# Patient Record
Sex: Male | Born: 1955 | Race: White | Hispanic: No | Marital: Married | Smoking: Never smoker
Health system: Southern US, Community
[De-identification: ages and names within clinical notes are randomized; demographics above are authoritative.]

## PROBLEM LIST (undated history)

## (undated) DIAGNOSIS — C801 Malignant (primary) neoplasm, unspecified: Secondary | ICD-10-CM

## (undated) DIAGNOSIS — B1920 Unspecified viral hepatitis C without hepatic coma: Secondary | ICD-10-CM

## (undated) HISTORY — PX: HERNIA REPAIR: SHX51

## (undated) HISTORY — DX: Unspecified viral hepatitis C without hepatic coma: B19.20

---

## 2018-01-18 ENCOUNTER — Emergency Department: Admission: EM | Admit: 2018-01-18 | Discharge: 2018-01-18 | Discharge status: Absent without leave | Payer: Self-pay

## 2018-01-18 NOTE — ED Triage Notes (Signed)
Pt arrives ambulatory to ED with c/o chest pain. Pt decided to leave upon registration without giving his address. Pt left ED at this time.

## 2019-01-12 ENCOUNTER — Emergency Department
Admission: EM | Admit: 2019-01-12 | Discharge: 2019-01-12 | Disposition: A | Discharge status: Home / Self Care | Payer: Medicaid Other | Attending: Emergency Medicine | Admitting: Emergency Medicine

## 2019-01-12 ENCOUNTER — Other Ambulatory Visit: Payer: Self-pay

## 2019-01-12 ENCOUNTER — Encounter: Payer: Self-pay | Admitting: Emergency Medicine

## 2019-01-12 ENCOUNTER — Telehealth: Payer: Self-pay | Admitting: Internal Medicine

## 2019-01-12 ENCOUNTER — Emergency Department: Payer: Medicaid Other

## 2019-01-12 DIAGNOSIS — Z79899 Other long term (current) drug therapy: Secondary | ICD-10-CM | POA: Insufficient documentation

## 2019-01-12 DIAGNOSIS — R9389 Abnormal findings on diagnostic imaging of other specified body structures: Secondary | ICD-10-CM | POA: Insufficient documentation

## 2019-01-12 DIAGNOSIS — R1030 Lower abdominal pain, unspecified: Secondary | ICD-10-CM | POA: Diagnosis present

## 2019-01-12 DIAGNOSIS — C229 Malignant neoplasm of liver, not specified as primary or secondary: Secondary | ICD-10-CM

## 2019-01-12 LAB — URINALYSIS, COMPLETE (UACMP) WITH MICROSCOPIC
Bacteria, UA: NONE SEEN
Bilirubin Urine: NEGATIVE
Glucose, UA: NEGATIVE mg/dL
Hgb urine dipstick: NEGATIVE
Ketones, ur: NEGATIVE mg/dL
Leukocytes,Ua: NEGATIVE
Nitrite: NEGATIVE
Protein, ur: 30 mg/dL — AB
Specific Gravity, Urine: 1.021 (ref 1.005–1.030)
Squamous Epithelial / HPF: NONE SEEN (ref 0–5)
pH: 5 (ref 5.0–8.0)

## 2019-01-12 LAB — CBC
HCT: 37.5 % — ABNORMAL LOW (ref 39.0–52.0)
Hemoglobin: 13.1 g/dL (ref 13.0–17.0)
MCH: 34.6 pg — ABNORMAL HIGH (ref 26.0–34.0)
MCHC: 34.9 g/dL (ref 30.0–36.0)
MCV: 98.9 fL (ref 80.0–100.0)
Platelets: 98 10*3/uL — ABNORMAL LOW (ref 150–400)
RBC: 3.79 MIL/uL — ABNORMAL LOW (ref 4.22–5.81)
RDW: 14.2 % (ref 11.5–15.5)
WBC: 5.6 10*3/uL (ref 4.0–10.5)
nRBC: 0 % (ref 0.0–0.2)

## 2019-01-12 LAB — COMPREHENSIVE METABOLIC PANEL
ALT: 67 U/L — ABNORMAL HIGH (ref 0–44)
AST: 177 U/L — ABNORMAL HIGH (ref 15–41)
Albumin: 2.7 g/dL — ABNORMAL LOW (ref 3.5–5.0)
Alkaline Phosphatase: 197 U/L — ABNORMAL HIGH (ref 38–126)
Anion gap: 9 (ref 5–15)
BUN: 17 mg/dL (ref 6–20)
CO2: 27 mmol/L (ref 22–32)
Calcium: 9.5 mg/dL (ref 8.9–10.3)
Chloride: 98 mmol/L (ref 98–111)
Creatinine, Ser: 0.42 mg/dL — ABNORMAL LOW (ref 0.61–1.24)
GFR calc Af Amer: >60 mL/min (ref >60)
GFR calc non Af Amer: >60 mL/min (ref >60)
Glucose, Bld: 106 mg/dL — ABNORMAL HIGH (ref 70–99)
Potassium: 4 mmol/L (ref 3.5–5.1)
Sodium: 134 mmol/L — ABNORMAL LOW (ref 135–145)
Total Bilirubin: 2.1 mg/dL — ABNORMAL HIGH (ref 0.3–1.2)
Total Protein: 7.5 g/dL (ref 6.5–8.1)

## 2019-01-12 LAB — LIPASE, BLOOD: Lipase: 32 U/L (ref 11–51)

## 2019-01-12 MED ORDER — IOHEXOL 240 MG/ML SOLN
50.0000 mL | Freq: Once | INTRAMUSCULAR | Status: AC | PRN
  Administered 2019-01-12: 09:00:00 50 mL via ORAL

## 2019-01-12 MED ORDER — TRAMADOL HCL 50 MG PO TABS: 50.0000 mg | ORAL_TABLET | Freq: Four times a day (QID) | ORAL | 0 refills | Status: DC | PRN

## 2019-01-12 MED ORDER — IOHEXOL 300 MG/ML  SOLN
100.0000 mL | Freq: Once | INTRAMUSCULAR | Status: AC | PRN
  Administered 2019-01-12: 10:00:00 100 mL via INTRAVENOUS

## 2019-01-12 MED ORDER — SODIUM CHLORIDE 0.9% FLUSH: 3.0000 mL | Freq: Once | INTRAVENOUS | Status: DC

## 2019-01-12 MED ORDER — TRAMADOL HCL 50 MG PO TABS
50.0000 mg | ORAL_TABLET | Freq: Once | ORAL | Status: AC
  Administered 2019-01-12: 50 mg via ORAL
  Filled 2019-01-12: qty 1

## 2019-01-12 NOTE — ED Notes (Signed)
Assumed care of patient a0x4, reports abd pain level 5/10. Ct completed. Vss. md aware of patients pain level. Awaiting further plan of care. Safety maintained. Call light w/i reach.m

## 2019-01-12 NOTE — ED Notes (Signed)
ED Provider at bedside. 

## 2019-01-12 NOTE — ED Triage Notes (Signed)
Says abdominal pain--mostly lower abdomen--for 3 weeks. Feels like gas pains.  Says it started with diarrhea.  He took otc meds and then got constipated.  Got better and then whent back to the diarrhea.  Has been missing work.

## 2019-01-12 NOTE — ED Notes (Signed)
sst top drawn and sent to lab.

## 2019-01-12 NOTE — Discharge Instructions (Addendum)
As we discussed your CT scan demonstrates multiple nodules in your lungs and liver that are concerning for cancer, I have discussed with Dr. Rogue Bussing and he anticipates close follow-up with you this week for further evaluation and plan

## 2019-01-12 NOTE — Telephone Encounter (Signed)
Spoke to Dr.Kinner; liver lesions/lung lesions; ? [place]. Medically stable; out pt follow up.    Please have pt follow up with me tomorrow at 2:45 [Dx- liver cancer]; no labs

## 2019-01-12 NOTE — ED Notes (Signed)
Patient transported to CT 

## 2019-01-12 NOTE — ED Notes (Signed)
md at bedside to discuss further plan of care. Patient sitting in bed watching TV.

## 2019-01-12 NOTE — ED Provider Notes (Signed)
[facility] Emergency Department Provider Note   ____________________________________________    I have reviewed the triage vital signs and the nursing notes.   HISTORY  Chief Complaint Abdominal Pain, Nausea, and Diarrhea     HPI Jacob Wilcox is a 63 y.o. male who presents with complaints of 3 weeks of lower abdominal pain which he describes as moderate to severe and constant.  He reports during that time he has had both diarrhea and constipation after taking over-the-counter Pepto-Bismol.  When he stopped his diarrhea has returned.  No recent travel.  No nausea or vomiting.  No fevers reported.  No cough  History reviewed. No pertinent past medical history.  There are no active problems to display for this patient.   Past Surgical History:  Procedure Laterality Date  . HERNIA REPAIR     not sure what it was--when he was a baby    Prior to Admission medications   Medication Sig Start Date End Date Taking? Authorizing Provider  Bismuth Subsalicylate (PEPTO-BISMOL PO) Take by mouth daily as needed.   Yes [provider]  Loperamide HCl (IMODIUM A-D PO) Take by mouth daily as needed.   Yes [provider]  vitamin B-12 (CYANOCOBALAMIN) 1000 MCG tablet Take 1,000 mcg by mouth daily.   Yes [provider]     Allergies Poison ivy extract  No family history on file.  Social History Social History   Tobacco Use  . Smoking status: Never Smoker  . Smokeless tobacco: Never Used  Substance Use Topics  . Alcohol use: Yes  . Drug use: Not on file    Review of Systems  Constitutional: No fever/chills Eyes: No visual changes.  ENT: No sore throat. Cardiovascular: Denies chest pain. Respiratory: Denies shortness of breath. Gastrointestinal: As above Genitourinary: Negative for dysuria. Musculoskeletal: Negative for back pain. Skin: Negative for rash. Neurological: Negative for headaches     ____________________________________________   PHYSICAL EXAM:  VITAL SIGNS: ED Triage Vitals [01/12/19 0848]  Enc Vitals Group     BP 116/64     Pulse Rate (!) 104     Resp 16     Temp 98.2 F (36.8 C)     Temp Source Oral     SpO2 96 %     Weight      Height      Head Circumference      Peak Flow      Pain Score 4     Pain Loc      Pain Edu?      Excl. in [place]?     Constitutional: Alert and oriented  Nose: No congestion/rhinnorhea. Mouth/Throat: Mucous membranes are moist.    Cardiovascular: Normal rate, regular rhythm. Grossly normal heart sounds.  Good peripheral circulation. Respiratory: Normal respiratory effort.  No retractions. Lungs CTAB. Gastrointestinal: Mild distention, tenderness in the left lower quadrant and right lower quadrant, no CVA tenderness  Musculoskeletal: 1+ edema bilaterally warm and well perfused Neurologic:  Normal speech and language. No gross focal neurologic deficits are appreciated.  Skin:  Skin is warm, dry and intact. No rash noted. Psychiatric: Mood and affect are normal. Speech and behavior are normal.  ____________________________________________   LABS (all labs ordered are listed, but only abnormal results are displayed)  Labs Reviewed  COMPREHENSIVE METABOLIC PANEL - Abnormal; Notable for the following components:      Result Value   Sodium 134 (*)    Glucose, Bld 106 (*)  Creatinine, Ser 0.42 (*)    Albumin 2.7 (*)    AST 177 (*)    ALT 67 (*)    Alkaline Phosphatase 197 (*)    Total Bilirubin 2.1 (*)    All other components within normal limits  CBC - Abnormal; Notable for the following components:   RBC 3.79 (*)    HCT 37.5 (*)    MCH 34.6 (*)    Platelets 98 (*)    All other components within normal limits  URINALYSIS, COMPLETE (UACMP) WITH MICROSCOPIC - Abnormal; Notable for the following components:   Color, Urine AMBER (*)    APPearance HAZY (*)    Protein, ur 30 (*)    All other components within normal  limits  LIPASE, BLOOD  AFP TUMOR MARKER   ____________________________________________  EKG  None ____________________________________________  RADIOLOGY  CT abdomen pelvis demonstrates findings concerning for metastatic cancer ____________________________________________   PROCEDURES  Procedure(s) performed: No  Procedures   Critical Care performed:No ____________________________________________   INITIAL IMPRESSION / ASSESSMENT AND PLAN / ED COURSE  Pertinent labs & imaging results that were available during my care of the patient were reviewed by me and considered in my medical decision making (see chart for details).  Patient presents with lower abdominal pain x3 weeks, he is tender in the left lower quadrant right lower quadrant.  We will obtain labs, imaging and reevaluate.  Patient CT scan highly concerning for metastatic CA, I discussed this with him at length.  He does have a history of daily alcohol use, hepatitis C making  Overly more likely.  Will discuss with oncology to arrange for close follow-up, plan   Discussed with Dr. Rogue Bussing, he will follow-up with patient this week, explained process to the patient he is comfortable with this plan    ____________________________________________   FINAL CLINICAL IMPRESSION(S) / ED DIAGNOSES  Final diagnoses:  Malignant neoplasm of liver, unspecified liver malignancy type [facility])        Note:  This document was prepared using Dragon voice recognition software and may include unintentional dictation errors.   Lavonia Drafts, MD 01/12/19 [phone]

## 2019-01-13 ENCOUNTER — Encounter: Payer: Self-pay | Admitting: Internal Medicine

## 2019-01-13 ENCOUNTER — Other Ambulatory Visit: Payer: Self-pay

## 2019-01-13 ENCOUNTER — Encounter: Payer: Self-pay | Admitting: *Deleted

## 2019-01-13 ENCOUNTER — Inpatient Hospital Stay: Payer: Medicaid Other

## 2019-01-13 ENCOUNTER — Inpatient Hospital Stay: Payer: Medicaid Other | Attending: Internal Medicine | Admitting: Internal Medicine

## 2019-01-13 VITALS — BP 127/80 | HR 98 | Temp 98.3°F | Resp 20 | Ht 70.0 in | Wt 158.0 lb

## 2019-01-13 DIAGNOSIS — R140 Abdominal distension (gaseous): Secondary | ICD-10-CM | POA: Insufficient documentation

## 2019-01-13 DIAGNOSIS — R160 Hepatomegaly, not elsewhere classified: Secondary | ICD-10-CM | POA: Diagnosis not present

## 2019-01-13 DIAGNOSIS — Z9221 Personal history of antineoplastic chemotherapy: Secondary | ICD-10-CM | POA: Insufficient documentation

## 2019-01-13 DIAGNOSIS — K7460 Unspecified cirrhosis of liver: Secondary | ICD-10-CM | POA: Insufficient documentation

## 2019-01-13 DIAGNOSIS — R5381 Other malaise: Secondary | ICD-10-CM | POA: Insufficient documentation

## 2019-01-13 DIAGNOSIS — Z7189 Other specified counseling: Secondary | ICD-10-CM

## 2019-01-13 DIAGNOSIS — R630 Anorexia: Secondary | ICD-10-CM | POA: Diagnosis not present

## 2019-01-13 DIAGNOSIS — R197 Diarrhea, unspecified: Secondary | ICD-10-CM | POA: Diagnosis not present

## 2019-01-13 DIAGNOSIS — C220 Liver cell carcinoma: Secondary | ICD-10-CM | POA: Insufficient documentation

## 2019-01-13 DIAGNOSIS — R5383 Other fatigue: Secondary | ICD-10-CM | POA: Diagnosis not present

## 2019-01-13 DIAGNOSIS — G893 Neoplasm related pain (acute) (chronic): Secondary | ICD-10-CM | POA: Insufficient documentation

## 2019-01-13 DIAGNOSIS — R634 Abnormal weight loss: Secondary | ICD-10-CM

## 2019-01-13 DIAGNOSIS — Z79899 Other long term (current) drug therapy: Secondary | ICD-10-CM | POA: Diagnosis not present

## 2019-01-13 DIAGNOSIS — F17210 Nicotine dependence, cigarettes, uncomplicated: Secondary | ICD-10-CM | POA: Diagnosis not present

## 2019-01-13 DIAGNOSIS — R600 Localized edema: Secondary | ICD-10-CM | POA: Diagnosis not present

## 2019-01-13 DIAGNOSIS — R110 Nausea: Secondary | ICD-10-CM | POA: Insufficient documentation

## 2019-01-13 DIAGNOSIS — R109 Unspecified abdominal pain: Secondary | ICD-10-CM

## 2019-01-13 DIAGNOSIS — R21 Rash and other nonspecific skin eruption: Secondary | ICD-10-CM | POA: Diagnosis not present

## 2019-01-13 DIAGNOSIS — Z5112 Encounter for antineoplastic immunotherapy: Secondary | ICD-10-CM | POA: Diagnosis present

## 2019-01-13 DIAGNOSIS — C229 Malignant neoplasm of liver, not specified as primary or secondary: Secondary | ICD-10-CM | POA: Insufficient documentation

## 2019-01-13 DIAGNOSIS — B1920 Unspecified viral hepatitis C without hepatic coma: Secondary | ICD-10-CM | POA: Insufficient documentation

## 2019-01-13 LAB — URINALYSIS, COMPLETE (UACMP) WITH MICROSCOPIC
Bacteria, UA: NONE SEEN
Bilirubin Urine: NEGATIVE
Glucose, UA: NEGATIVE mg/dL
Hgb urine dipstick: NEGATIVE
Ketones, ur: NEGATIVE mg/dL
Leukocytes,Ua: NEGATIVE
Nitrite: NEGATIVE
Protein, ur: NEGATIVE mg/dL
Specific Gravity, Urine: 1.019 (ref 1.005–1.030)
Squamous Epithelial / LPF: NONE SEEN (ref 0–5)
pH: 5 (ref 5.0–8.0)

## 2019-01-13 LAB — APTT: aPTT: 28 seconds (ref 24–36)

## 2019-01-13 LAB — PROTIME-INR
INR: 1.1 (ref 0.8–1.2)
Prothrombin Time: 13.7 seconds (ref 11.4–15.2)

## 2019-01-13 LAB — AFP TUMOR MARKER

## 2019-01-13 LAB — TSH: TSH: 1.627 u[IU]/mL (ref 0.350–4.500)

## 2019-01-13 MED ORDER — OXYCODONE HCL 5 MG PO TABS: 5.0000 mg | ORAL_TABLET | Freq: Four times a day (QID) | ORAL | 0 refills | Status: DC | PRN

## 2019-01-13 NOTE — Progress Notes (Signed)
[facility] CONSULT NOTE  Patient Care Team: Patient, No Pcp Per as PCP - General (General Practice)  CHIEF COMPLAINTS/PURPOSE OF CONSULTATION: Liver cancer  #  Oncology History Overview Note  # July 2020-   # Early 1990s- Hep C [needle sharing >35 years in [place]; no treatment.    Cancer, hepatocellular ([place])  01/13/2019 Initial Diagnosis   Cancer, hepatocellular ([place])   01/14/2019 -  Chemotherapy   The patient had bevacizumab (AVASTIN) 1,075 mg in sodium chloride 0.9 % 100 mL chemo infusion, 15 mg/kg, Intravenous,  Once, 0 of 6 cycles atezolizumab (TECENTRIQ) 1,200 mg in sodium chloride 0.9 % 250 mL chemo infusion, 1,200 mg, Intravenous, Once, 0 of 6 cycles  for chemotherapy treatment.       HISTORY OF PRESENTING ILLNESS:  Jacob Wilcox 63 y.o.  male patient with history of smoking/alcohol use/hepatitis C [untreated] is currently in the clinic to be evaluated for his liver mass after found on imaging in the emergency room a day ago.  Patient states that he has been having diarrhea 8-10 loose stools a day without any blood for the last 2 weeks.  Complains of abdominal cramps.  Also notes to have abdominal discomfort/distention with bloating over the last 2 to 3 weeks.  Complains of poor appetite.  Positive for nausea no vomiting.  Weight loss.  Extreme fatigue.  Review of Systems  Constitutional: Positive for malaise/fatigue and weight loss. Negative for chills, diaphoresis and fever.  HENT: Negative for nosebleeds and sore throat.   Eyes: Negative for double vision.  Respiratory: Negative for cough, hemoptysis, sputum production, shortness of breath and wheezing.   Cardiovascular: Negative for chest pain, palpitations, orthopnea and leg swelling.  Gastrointestinal: Positive for abdominal pain, diarrhea and nausea. Negative for blood in stool, constipation, heartburn and melena.  Genitourinary: Negative for dysuria, frequency and urgency.  Musculoskeletal:  Negative for back pain and joint pain.  Skin: Negative.  Negative for itching and rash.  Neurological: Negative for dizziness, tingling, focal weakness, weakness and headaches.  Endo/Heme/Allergies: Does not bruise/bleed easily.  Psychiatric/Behavioral: Negative for depression. The patient is not nervous/anxious and does not have insomnia.      MEDICAL HISTORY:  History reviewed. No pertinent past medical history.  SURGICAL HISTORY: Past Surgical History:  Procedure Laterality Date  . HERNIA REPAIR     not sure what it was--when he was a baby    SOCIAL HISTORY: Social History   Socioeconomic History  . Marital status: Married    Spouse name: Not on file  . Number of children: Not on file  . Years of education: Not on file  . Highest education level: Not on file  Occupational History  . Not on file  Social Needs  . Financial resource strain: Not on file  . Food insecurity    Worry: Not on file    Inability: Not on file  . Transportation needs    Medical: Not on file    Non-medical: Not on file  Tobacco Use  . Smoking status: Never Smoker  . Smokeless tobacco: Never Used  Substance and Sexual Activity  . Alcohol use: Yes  . Drug use: Not on file  . Sexual activity: Not on file  Lifestyle  . Physical activity    Days per week: Not on file    Minutes per session: Not on file  . Stress: Not on file  Relationships  . Social connections    Talks on phone: Not on file  Gets together: Not on file    Attends religious service: Not on file    Active member of club or organization: Not on file    Attends meetings of clubs or organizations: Not on file    Relationship status: Not on file  . Intimate partner violence    Fear of current or ex partner: Not on file    Emotionally abused: Not on file    Physically abused: Not on file    Forced sexual activity: Not on file  Other Topics Concern  . Not on file  Social History Narrative   Lives in [place]; with wife;  Engineer, drilling; smoke 1ppd x 30 years; alcohol 6 pack beer/day.     FAMILY HISTORY: History reviewed. No pertinent family history.  ALLERGIES:  is allergic to poison ivy extract.  MEDICATIONS:  Current Outpatient Medications  Medication Sig Dispense Refill  . Loperamide HCl (IMODIUM A-D PO) Take by mouth daily as needed.    Marland Kitchen oxyCODONE (OXY IR/ROXICODONE) 5 MG immediate release tablet Take 1 tablet (5 mg total) by mouth every 6 (six) hours as needed for severe pain. 45 tablet 0  . traMADol (ULTRAM) 50 MG tablet Take 1 tablet (50 mg total) by mouth every 6 (six) hours as needed. (Patient not taking: Reported on 01/13/2019) 20 tablet 0  . vitamin B-12 (CYANOCOBALAMIN) 1000 MCG tablet Take 1,000 mcg by mouth daily.     No current facility-administered medications for this visit.       Marland Kitchen  PHYSICAL EXAMINATION: ECOG PERFORMANCE STATUS: 1 - Symptomatic but completely ambulatory  Vitals:   01/13/19 1520  BP: 127/80  Pulse: 98  Resp: 20  Temp: 98.3 F (36.8 C)   Filed Weights   01/13/19 1520  Weight: 158 lb (71.7 kg)    Physical Exam  Constitutional: He is oriented to person, place, and time and well-developed, well-nourished, and in no distress.  Mild distress because of abdominal cramping.  Alone.  Wife over the phone.  HENT:  Head: Normocephalic and atraumatic.  Mouth/Throat: Oropharynx is clear and moist. No oropharyngeal exudate.  Eyes: Pupils are equal, round, and reactive to light.  Neck: Normal range of motion. Neck supple.  Cardiovascular: Normal rate and regular rhythm.  Pulmonary/Chest: No respiratory distress. He has no wheezes.  Decreased air entry bilaterally.  Abdominal: Soft. Bowel sounds are normal. He exhibits distension. He exhibits no mass. There is abdominal tenderness. There is no rebound and no guarding.  Positive for hepatomegaly/up to the umbilicus.  Musculoskeletal: Normal range of motion.        General: No tenderness or edema.  Neurological: He is  alert and oriented to person, place, and time.  Skin: Skin is warm.  Psychiatric: Affect normal.     LABORATORY DATA:  I have reviewed the data as listed Lab Results  Component Value Date   WBC 5.6 01/12/2019   HGB 13.1 01/12/2019   HCT 37.5 (L) 01/12/2019   MCV 98.9 01/12/2019   PLT 98 (L) 01/12/2019   Recent Labs    01/12/19 0911  NA 134*  K 4.0  CL 98  CO2 27  GLUCOSE 106*  BUN 17  CREATININE 0.42*  CALCIUM 9.5  GFRNONAA >60  GFRAA >60  PROT 7.5  ALBUMIN 2.7*  AST 177*  ALT 67*  ALKPHOS 197*  BILITOT 2.1*    RADIOGRAPHIC STUDIES: I have personally reviewed the radiological images as listed and agreed with the findings in the report. Ct Abdomen Pelvis W Contrast  Result Date: 01/12/2019 CLINICAL DATA:  Abdominal distension, lower abdominal pain EXAM: CT ABDOMEN AND PELVIS WITH CONTRAST TECHNIQUE: Multidetector CT imaging of the abdomen and pelvis was performed using the standard protocol following bolus administration of intravenous contrast. CONTRAST:  133mL OMNIPAQUE IOHEXOL 300 MG/ML  SOLN COMPARISON:  None. FINDINGS: Lower chest: Numerous bilateral pulmonary nodules seen in the lower lungs. Index right lower lobe nodule on image 1 measures 9 mm. Index left lower lobe nodule on image 4 measures 7 mm. No effusions. Heart is normal size. Hepatobiliary: Nodular contours within the liver compatible with cirrhosis. There are numerous ill-defined low-density masses in the liver concerning for malignancy. Index lesion in the hepatic dome measures approximately 5.4 cm. Ill-defined low-density lesions throughout the right hepatic lobe predominantly. Filling defect in the main portal vein and left portal vein compatible with partially occlusive thrombus. Right portal vein is not visualized, likely occluded. Pancreas: No focal abnormality or ductal dilatation. Spleen: No focal abnormality.  Normal size. Adrenals/Urinary Tract: No adrenal abnormality. No focal renal abnormality. No  stones or hydronephrosis. Urinary bladder is unremarkable. Stomach/Bowel: Stomach, large and small bowel grossly unremarkable. Vascular/Lymphatic: Aortic atherosclerosis. No enlarged abdominal or pelvic lymph nodes. Reproductive: No visible focal abnormality. Other: Moderate free fluid in the pelvis and around the liver. No free air. Musculoskeletal: No acute bony abnormality. IMPRESSION: Nodular contours of the liver with enlargement of the left hepatic lobe compatible with cirrhosis. Extensive ill-defined low-density areas predominantly throughout the right lobe of the liver. It is difficult to determine if this is primary hepatocellular carcinoma or metastases. 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 Right portal vein is not visualized and may be occluded. Numerous bilateral lower lobe lung pulmonary nodules compatible with metastases. Moderate ascites around the liver and in the pelvis. Aortic atherosclerosis. Electronically Signed   By: Rolm Baptise M.D.   On: 01/12/2019 10:45    ASSESSMENT & PLAN:   Cancer, hepatocellular ([place]) #Multiple liver masses-elevated alpha-fetoprotein 3 40,000; highly suggestive of hepatocellular cancer.  Discussed my concerns with the patient and wife in detail.  #I would recommend further MRI of the liver stat for further evaluation.  If inconclusive would recommend biopsy.  #Discussed the treatment options for hepatocellular cancer [if confirmed on MRI plus minus biopsy]-includes multiple options-however I feel that given current clinical situation need for response-would recommend Tecentriq plus Avastin every 3 weeks.  Discussed the potential side effects including but not limited to bleeding wound healing issues.  I discussed the mechanism of action; The goal of therapy is palliative; and length of treatments are likely ongoing/based upon the results of the scans. Discussed the potential side effects of immunotherapy including but not  limited to diarrhea; skin rash; elevated LFTs/endocrine abnormalities etc. discussed response rates therapy 30%.  Goal of treatments are palliative not curative.   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  Discussed with radiology hard to differentiate; possibly tumor embolus.  Await MRI.  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  Check PT PTT.  # Hepatitis C-untreated-check hepatitis C RNA; antibodies.  # Diarrhea/ abdominal cramps-the etiology is unclear; CT scan shows no acute small bowel/colon process.  Check GI PCR/C. difficile panel.  Continue Imodium prescription of oxycodone given.  #Social issues-no insurance.  Will refer to Education officer, museum.  Thank you,allowing me to participate in the care of your pleasant patient. Please do not hesitate to contact me with questions or concerns in the interim.  DISPOSITION: #labs-  today; # Stool studies/ C.diff; UA # MRI LIVER STAT # covid-19 testing.  # chemo class ASAP/palliative care consultation. # Follow up in 1 week- MD; No labs- start Avastin/ Tecntriq-Dr.B     All questions were answered. The patient knows to call the clinic with any problems, questions or concerns.       Cammie Sickle, MD 01/14/2019 4:27 PM

## 2019-01-13 NOTE — Patient Instructions (Signed)
We have scheduled your covid screening/testing on July 23rd at 1pm.  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 Someone will direct you where to park. Stay in your car and someone will be with you shortly.  You must self isolate after this test until the day of your first infusion. This is to ensure you are not exposed to anyone with covid in the interim of receiving these test results.

## 2019-01-13 NOTE — Patient Instructions (Signed)
Bevacizumab injection What is this medicine? BEVACIZUMAB (be va SIZ yoo mab) is a monoclonal antibody. It is used to treat many types of cancer. This medicine may be used for other purposes; ask your health care provider or pharmacist if you have questions. COMMON BRAND NAME(S): Avastin, MVASI, Zirabev What should I tell my health care provider before I take this medicine? They need to know if you have any of these conditions:  diabetes  heart disease  high blood pressure  history of coughing up blood  prior anthracycline chemotherapy (e.g., doxorubicin, daunorubicin, epirubicin)  recent or ongoing radiation therapy  recent or planning to have surgery  stroke  an unusual or allergic reaction to bevacizumab, hamster proteins, mouse proteins, other medicines, foods, dyes, or preservatives  pregnant or trying to get pregnant  breast-feeding How should I use this medicine? This medicine is for infusion into a vein. It is given by a health care professional in a hospital or clinic setting. Talk to your pediatrician regarding the use of this medicine in children. Special care may be needed. Overdosage: If you think you have taken too much of this medicine contact a poison control center or emergency room at once. NOTE: This medicine is only for you. Do not share this medicine with others. What if I miss a dose? It is important not to miss your dose. Call your doctor or health care professional if you are unable to keep an appointment. What may interact with this medicine? Interactions are not expected. This list may not describe all possible interactions. Give your health care provider a list of all the medicines, herbs, non-prescription drugs, or dietary supplements you use. Also tell them if you smoke, drink alcohol, or use illegal drugs. Some items may interact with your medicine. What should I watch for while using this medicine? Your condition will be monitored carefully while  you are receiving this medicine. You will need important blood work and urine testing done while you are taking this medicine. This medicine may increase your risk to bruise or bleed. Call your doctor or health care professional if you notice any unusual bleeding. This medicine should be started at least 28 days following major surgery and the site of the surgery should be totally healed. Check with your doctor before scheduling dental work or surgery while you are receiving this treatment. Talk to your doctor if you have recently had surgery or if you have a wound that has not healed. Do not become pregnant while taking this medicine or for 6 months after stopping it. Women should inform their doctor if they wish to become pregnant or think they might be pregnant. There is a potential for serious side effects to an unborn child. Talk to your health care professional or pharmacist for more information. Do not breast-feed an infant while taking this medicine and for 6 months after the last dose. This medicine has caused ovarian failure in some women. This medicine may interfere with the ability to have a child. You should talk to your doctor or health care professional if you are concerned about your fertility. What side effects may I notice from receiving this medicine? Side effects that you should report to your doctor or health care professional as soon as possible:  allergic reactions like skin rash, itching or hives, swelling of the face, lips, or tongue  chest pain or chest tightness  chills  coughing up blood  high fever  seizures  severe constipation  signs and symptoms   of bleeding such as bloody or black, tarry stools; red or dark-brown urine; spitting up blood or brown material that looks like coffee grounds; red spots on the skin; unusual bruising or bleeding from the eye, gums, or nose  signs and symptoms of a blood clot such as breathing problems; chest pain; severe, sudden  headache; pain, swelling, warmth in the leg  signs and symptoms of a stroke like changes in vision; confusion; trouble speaking or understanding; severe headaches; sudden numbness or weakness of the face, arm or leg; trouble walking; dizziness; loss of balance or coordination  stomach pain  sweating  swelling of legs or ankles  vomiting  weight gain Side effects that usually do not require medical attention (report to your doctor or health care professional if they continue or are bothersome):  back pain  changes in taste  decreased appetite  dry skin  nausea  tiredness This list may not describe all possible side effects. Call your doctor for medical advice about side effects. You may report side effects to FDA at 1-800-FDA-1088. Where should I keep my medicine? This drug is given in a hospital or clinic and will not be stored at home. NOTE: This sheet is a summary. It may not cover all possible information. If you have questions about this medicine, talk to your doctor, pharmacist, or health care provider.  2020 Elsevier/Gold Standard (2016-06-08 14:33:29) Atezolizumab injection What is this medicine? ATEZOLIZUMAB (a te zoe LIZ ue mab) is a monoclonal antibody. It is used to treat bladder cancer (urothelial cancer), non-small cell lung cancer, small cell lung cancer, and breast cancer. This medicine may be used for other purposes; ask your health care provider or pharmacist if you have questions. COMMON BRAND NAME(S): Tecentriq What should I tell my health care provider before I take this medicine? They need to know if you have any of these conditions:  diabetes  immune system problems  infection  inflammatory bowel disease  liver disease  lung or breathing disease  lupus  nervous system problems like myasthenia gravis or Guillain-Barre syndrome  organ transplant  an unusual or allergic reaction to atezolizumab, other medicines, foods, dyes, or  preservatives  pregnant or trying to get pregnant  breast-feeding How should I use this medicine? This medicine is for infusion into a vein. It is given by a health care professional in a hospital or clinic setting. A special MedGuide will be given to you before each treatment. Be sure to read this information carefully each time. Talk to your pediatrician regarding the use of this medicine in children. Special care may be needed. Overdosage: If you think you have taken too much of this medicine contact a poison control center or emergency room at once. NOTE: This medicine is only for you. Do not share this medicine with others. What if I miss a dose? It is important not to miss your dose. Call your doctor or health care professional if you are unable to keep an appointment. What may interact with this medicine? Interactions have not been studied. This list may not describe all possible interactions. Give your health care provider a list of all the medicines, herbs, non-prescription drugs, or dietary supplements you use. Also tell them if you smoke, drink alcohol, or use illegal drugs. Some items may interact with your medicine. What should I watch for while using this medicine? Your condition will be monitored carefully while you are receiving this medicine. You may need blood work done while you are taking   this medicine. Do not become pregnant while taking this medicine or for at least 5 months after stopping it. Women should inform their doctor if they wish to become pregnant or think they might be pregnant. There is a potential for serious side effects to an unborn child. Talk to your health care professional or pharmacist for more information. Do not breast-feed an infant while taking this medicine or for at least 5 months after the last dose. What side effects may I notice from receiving this medicine? Side effects that you should report to your doctor or health care professional as soon  as possible:  allergic reactions like skin rash, itching or hives, swelling of the face, lips, or tongue  black, tarry stools  bloody or watery diarrhea  breathing problems  changes in vision  chest pain or chest tightness  chills  facial flushing  fever  headache  signs and symptoms of high blood sugar such as dizziness; dry mouth; dry skin; fruity breath; nausea; stomach pain; increased hunger or thirst; increased urination  signs and symptoms of liver injury like dark yellow or brown urine; general ill feeling or flu-like symptoms; light-colored stools; loss of appetite; nausea; right upper belly pain; unusually weak or tired; yellowing of the eyes or skin  stomach pain  trouble passing urine or change in the amount of urine Side effects that usually do not require medical attention (report to your doctor or health care professional if they continue or are bothersome):  cough  diarrhea  joint pain  muscle pain  muscle weakness  tiredness  weight loss This list may not describe all possible side effects. Call your doctor for medical advice about side effects. You may report side effects to FDA at 1-800-FDA-1088. Where should I keep my medicine? This drug is given in a hospital or clinic and will not be stored at home. NOTE: This sheet is a summary. It may not cover all possible information. If you have questions about this medicine, talk to your doctor, pharmacist, or health care provider.  2020 Elsevier/Gold Standard (2017-09-13 09:33:38)  

## 2019-01-13 NOTE — Assessment & Plan Note (Addendum)
#  Multiple liver masses-elevated alpha-fetoprotein 3 40,000; highly suggestive of hepatocellular cancer.  Discussed my concerns with the patient and wife in detail.  #I would recommend further MRI of the liver stat for further evaluation.  If inconclusive would recommend biopsy.  #Discussed the treatment options for hepatocellular cancer [if confirmed on MRI plus minus biopsy]-includes multiple options-however I feel that given current clinical situation need for response-would recommend Tecentriq plus Avastin every 3 weeks.  Discussed the potential side effects including but not limited to bleeding wound healing issues.  I discussed the mechanism of action; The goal of therapy is palliative; and length of treatments are likely ongoing/based upon the results of the scans. Discussed the potential side effects of immunotherapy including but not limited to diarrhea; skin rash; elevated LFTs/endocrine abnormalities etc. discussed response rates therapy 30%.  Goal of treatments are palliative not curative.   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  Discussed with radiology hard to differentiate; possibly tumor embolus.  Await MRI.  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  Check PT PTT.  # Hepatitis C-untreated-check hepatitis C RNA; antibodies.  # Diarrhea/ abdominal cramps-the etiology is unclear; CT scan shows no acute small bowel/colon process.  Check GI PCR/C. difficile panel.  Continue Imodium prescription of oxycodone given.  #Social issues-no insurance.  Will refer to Education officer, museum.  Thank you,allowing me to participate in the care of your pleasant patient. Please do not hesitate to contact me with questions or concerns in the interim.  DISPOSITION: #labs- today; # Stool studies/ C.diff; UA # MRI LIVER STAT # covid-19 testing.  # chemo class ASAP/palliative care consultation. # Follow up in 1 week- MD; No labs- start Avastin/ Tecntriq-Dr.B

## 2019-01-14 ENCOUNTER — Other Ambulatory Visit: Payer: Self-pay

## 2019-01-14 ENCOUNTER — Inpatient Hospital Stay: Payer: Medicaid Other

## 2019-01-14 DIAGNOSIS — C220 Liver cell carcinoma: Secondary | ICD-10-CM

## 2019-01-14 DIAGNOSIS — R197 Diarrhea, unspecified: Secondary | ICD-10-CM

## 2019-01-14 DIAGNOSIS — Z7189 Other specified counseling: Secondary | ICD-10-CM | POA: Insufficient documentation

## 2019-01-14 DIAGNOSIS — Z5112 Encounter for antineoplastic immunotherapy: Secondary | ICD-10-CM | POA: Diagnosis not present

## 2019-01-14 LAB — GASTROINTESTINAL PANEL BY PCR, STOOL (REPLACES STOOL CULTURE)

## 2019-01-14 LAB — C DIFFICILE QUICK SCREEN W PCR REFLEX
C Diff antigen: NEGATIVE
C Diff interpretation: NOT DETECTED
C Diff toxin: NEGATIVE

## 2019-01-14 LAB — HEPATITIS C ANTIBODY: HCV Ab: >11 s/co ratio — ABNORMAL HIGH (ref 0.0–0.9)

## 2019-01-14 NOTE — Progress Notes (Signed)
START OFF PATHWAY REGIMEN - Other   [id number]:Atezolizumab 1,200 mg IV D1 + Bevacizumab 15 mg/kg IV D1 q21 Days:   A cycle is every 21 Days:     Atezolizumab      Bevacizumab-xxxx   **Always confirm dose/schedule in your pharmacy ordering system**  Patient Characteristics: Intent of Therapy: Non-Curative / Palliative Intent, Discussed with Patient 

## 2019-01-15 ENCOUNTER — Other Ambulatory Visit: Payer: Self-pay

## 2019-01-15 ENCOUNTER — Inpatient Hospital Stay (HOSPITAL_BASED_OUTPATIENT_CLINIC_OR_DEPARTMENT_OTHER): Payer: Medicaid Other | Admitting: Internal Medicine

## 2019-01-15 ENCOUNTER — Ambulatory Visit
Admission: RE | Admit: 2019-01-15 | Discharge: 2019-01-15 | Disposition: A | Discharge status: Home / Self Care | Payer: Medicaid Other | Source: Ambulatory Visit | Attending: Internal Medicine | Admitting: Internal Medicine

## 2019-01-15 ENCOUNTER — Telehealth: Payer: Self-pay | Admitting: Internal Medicine

## 2019-01-15 ENCOUNTER — Emergency Department: Admission: EM | Admit: 2019-01-15 | Discharge: 2019-01-15 | Discharge status: Against Medical Advice | Payer: Self-pay

## 2019-01-15 ENCOUNTER — Encounter: Payer: Self-pay | Admitting: Internal Medicine

## 2019-01-15 DIAGNOSIS — B1920 Unspecified viral hepatitis C without hepatic coma: Secondary | ICD-10-CM

## 2019-01-15 DIAGNOSIS — C220 Liver cell carcinoma: Secondary | ICD-10-CM

## 2019-01-15 DIAGNOSIS — R140 Abdominal distension (gaseous): Secondary | ICD-10-CM

## 2019-01-15 DIAGNOSIS — Z5112 Encounter for antineoplastic immunotherapy: Secondary | ICD-10-CM | POA: Diagnosis not present

## 2019-01-15 DIAGNOSIS — R197 Diarrhea, unspecified: Secondary | ICD-10-CM

## 2019-01-15 DIAGNOSIS — R5381 Other malaise: Secondary | ICD-10-CM

## 2019-01-15 DIAGNOSIS — R160 Hepatomegaly, not elsewhere classified: Secondary | ICD-10-CM

## 2019-01-15 DIAGNOSIS — Z79899 Other long term (current) drug therapy: Secondary | ICD-10-CM

## 2019-01-15 DIAGNOSIS — R5383 Other fatigue: Secondary | ICD-10-CM

## 2019-01-15 DIAGNOSIS — R630 Anorexia: Secondary | ICD-10-CM

## 2019-01-15 DIAGNOSIS — G893 Neoplasm related pain (acute) (chronic): Secondary | ICD-10-CM

## 2019-01-15 DIAGNOSIS — R109 Unspecified abdominal pain: Secondary | ICD-10-CM

## 2019-01-15 DIAGNOSIS — R634 Abnormal weight loss: Secondary | ICD-10-CM

## 2019-01-15 DIAGNOSIS — F17210 Nicotine dependence, cigarettes, uncomplicated: Secondary | ICD-10-CM

## 2019-01-15 DIAGNOSIS — R110 Nausea: Secondary | ICD-10-CM

## 2019-01-15 DIAGNOSIS — Z9221 Personal history of antineoplastic chemotherapy: Secondary | ICD-10-CM

## 2019-01-15 MED ORDER — GADOBUTROL 1 MMOL/ML IV SOLN
7.0000 mL | Freq: Once | INTRAVENOUS | Status: AC | PRN
  Administered 2019-01-15: 7 mL via INTRAVENOUS

## 2019-01-15 NOTE — Progress Notes (Signed)
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    Cancer, hepatocellular ([place])  01/13/2019 Initial Diagnosis   Cancer, hepatocellular ([place])   01/14/2019 -  Chemotherapy   The patient had bevacizumab (AVASTIN) 1,075 mg in sodium chloride 0.9 % 100 mL chemo infusion, 15 mg/kg, Intravenous,  Once, 0 of 6 cycles atezolizumab (TECENTRIQ) 1,200 mg in sodium chloride 0.9 % 250 mL chemo infusion, 1,200 mg, Intravenous, Once, 0 of 6 cycles  for chemotherapy treatment.       HISTORY OF PRESENTING ILLNESS:  Jacob Wilcox 63 y.o.  male patient with history of smoking/alcohol use/hepatitis C [untreated]-noted to have a large liver lesion is here to review the results of his MRI liver that he had this morning.  Patient continues to have up to 10 loose stools a day; continues to have abdominal pain.  He has not been able to start oxycodone because of financial reasons.  He is not taking Imodium as recommended because again financial reasons.  Patient had " constipation" with Pepto-Bismol.  He is currently not taking Pepto-Bismol.  Feels poorly.   Review of Systems  Constitutional: Positive for malaise/fatigue and weight loss. Negative for chills, diaphoresis and fever.  HENT: Negative for nosebleeds and sore throat.   Eyes: Negative for double vision.  Respiratory: Negative for cough, hemoptysis, sputum production, shortness of breath and wheezing.   Cardiovascular: Negative for chest pain, palpitations, orthopnea and leg swelling.   Gastrointestinal: Positive for abdominal pain, diarrhea and nausea. Negative for blood in stool, constipation, heartburn and melena.  Genitourinary: Negative for dysuria, frequency and urgency.  Musculoskeletal: Negative for back pain and joint pain.  Skin: Negative.  Negative for itching and rash.  Neurological: Negative for dizziness, tingling, focal weakness, weakness and headaches.  Endo/Heme/Allergies: Does not bruise/bleed easily.  Psychiatric/Behavioral: Negative for depression. The patient is not nervous/anxious and does not have insomnia.      MEDICAL HISTORY:  Past Medical History:  Diagnosis Date  . Hepatitis C     SURGICAL HISTORY: Past Surgical History:  Procedure Laterality Date  . HERNIA REPAIR     not sure what it was--when he was a baby    SOCIAL HISTORY: Social History   Socioeconomic History  . Marital status: Married    Spouse name: Not on file  . Number of children: Not on file  . Years of education: Not on file  . Highest education level: Not on file  Occupational History  . Not on file  Social Needs  . Financial resource strain: Not on file  . Food insecurity    Worry: Not on file    Inability: Not on file  . Transportation needs    Medical: Not on file    Non-medical: Not on file  Tobacco Use  . Smoking status: Never Smoker  . Smokeless tobacco: Never Used  Substance and Sexual Activity  . Alcohol use: Yes  . Drug use: Not on file  . Sexual activity: Not on file  Lifestyle  . Physical activity    Days  per week: Not on file    Minutes per session: Not on file  . Stress: Not on file  Relationships  . Social Herbalist on phone: Not on file    Gets together: Not on file    Attends religious service: Not on file    Active member of club or organization: Not on file    Attends meetings of clubs or organizations: Not on file    Relationship status: Not on file  . Intimate partner violence    Fear of current or ex partner: Not  on file    Emotionally abused: Not on file    Physically abused: Not on file    Forced sexual activity: Not on file  Other Topics Concern  . Not on file  Social History Narrative   Lives in [place]; with wife; Engineer, drilling; smoke 1ppd x 30 years; alcohol 6 pack beer/day.     FAMILY HISTORY: No family history on file.  ALLERGIES:  is allergic to poison ivy extract.  MEDICATIONS:  Current Outpatient Medications  Medication Sig Dispense Refill  . Loperamide HCl (IMODIUM A-D PO) Take by mouth daily as needed.    Marland Kitchen oxyCODONE (OXY IR/ROXICODONE) 5 MG immediate release tablet Take 1 tablet (5 mg total) by mouth every 6 (six) hours as needed for severe pain. 45 tablet 0  . traMADol (ULTRAM) 50 MG tablet Take 1 tablet (50 mg total) by mouth every 6 (six) hours as needed. (Patient not taking: Reported on 01/13/2019) 20 tablet 0  . vitamin B-12 (CYANOCOBALAMIN) 1000 MCG tablet Take 1,000 mcg by mouth daily.     No current facility-administered medications for this visit.       Marland Kitchen  PHYSICAL EXAMINATION: ECOG PERFORMANCE STATUS: 1 - Symptomatic but completely ambulatory  Vitals:   01/15/19 1330  BP: 118/74  Pulse: (!) 101  Resp: 20  Temp: 98.3 F (36.8 C)   There were no vitals filed for this visit.  Physical Exam  Constitutional: He is oriented to person, place, and time and well-developed, well-nourished, and in no distress.  Mild distress because of abdominal cramping.  Alone.    HENT:  Head: Normocephalic and atraumatic.  Mouth/Throat: Oropharynx is clear and moist. No oropharyngeal exudate.  Eyes: Pupils are equal, round, and reactive to light.  Neck: Normal range of motion. Neck supple.  Cardiovascular: Normal rate and regular rhythm.  Pulmonary/Chest: No respiratory distress. He has no wheezes.  Decreased air entry bilaterally.  Abdominal: Soft. Bowel sounds are normal. He exhibits distension. He exhibits no mass. There is abdominal tenderness. There is no rebound and no  guarding.  Positive for hepatomegaly/up to the umbilicus.  No significant ascitic fluid noted.  Musculoskeletal: Normal range of motion.        General: No tenderness or edema.  Neurological: He is alert and oriented to person, place, and time.  Skin: Skin is warm.  Psychiatric: Affect normal.     LABORATORY DATA:  I have reviewed the data as listed Lab Results  Component Value Date   WBC 5.6 01/12/2019   HGB 13.1 01/12/2019   HCT 37.5 (L) 01/12/2019   MCV 98.9 01/12/2019   PLT 98 (L) 01/12/2019   Recent Labs    01/12/19 0911  NA 134*  K 4.0  CL 98  CO2 27  GLUCOSE 106*  BUN 17  CREATININE 0.42*  CALCIUM 9.5  GFRNONAA >60  GFRAA >60  PROT 7.5  ALBUMIN 2.7*  AST 177*  ALT 67*  ALKPHOS 197*  BILITOT 2.1*    RADIOGRAPHIC STUDIES: I have personally reviewed the radiological images as listed and agreed with the findings in the report. Ct Abdomen Pelvis W Contrast  Result Date: 01/12/2019 CLINICAL DATA:  Abdominal distension, lower abdominal pain EXAM: CT ABDOMEN AND PELVIS WITH CONTRAST TECHNIQUE: Multidetector CT imaging of the abdomen and pelvis was performed using the standard protocol following bolus administration of intravenous contrast. CONTRAST:  166mL OMNIPAQUE IOHEXOL 300 MG/ML  SOLN COMPARISON:  None. FINDINGS: Lower chest: Numerous bilateral pulmonary nodules seen in the lower lungs. Index right lower lobe nodule on image 1 measures 9 mm. Index left lower lobe nodule on image 4 measures 7 mm. No effusions. Heart is normal size. Hepatobiliary: Nodular contours within the liver compatible with cirrhosis. There are numerous ill-defined low-density masses in the liver concerning for malignancy. Index lesion in the hepatic dome measures approximately 5.4 cm. Ill-defined low-density lesions throughout the right hepatic lobe predominantly. Filling defect in the main portal vein and left portal vein compatible with partially occlusive thrombus. Right portal vein is not  visualized, likely occluded. Pancreas: No focal abnormality or ductal dilatation. Spleen: No focal abnormality.  Normal size. Adrenals/Urinary Tract: No adrenal abnormality. No focal renal abnormality. No stones or hydronephrosis. Urinary bladder is unremarkable. Stomach/Bowel: Stomach, large and small bowel grossly unremarkable. Vascular/Lymphatic: Aortic atherosclerosis. No enlarged abdominal or pelvic lymph nodes. Reproductive: No visible focal abnormality. Other: Moderate free fluid in the pelvis and around the liver. No free air. Musculoskeletal: No acute bony abnormality. IMPRESSION: Nodular contours of the liver with enlargement of the left hepatic lobe compatible with cirrhosis. Extensive ill-defined low-density areas predominantly throughout the right lobe of the liver. It is difficult to determine if this is primary hepatocellular carcinoma or metastases. 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 Right portal vein is not visualized and may be occluded. Numerous bilateral lower lobe lung pulmonary nodules compatible with metastases. Moderate ascites around the liver and in the pelvis. Aortic atherosclerosis. Electronically Signed   By: Rolm Baptise M.D.   On: 01/12/2019 10:45   Mr Liver W Wo Contrast  Result Date: 01/15/2019 CLINICAL DATA:  63 year old male with liver lesion noted on prior CT examination. History of hepatitis C and cirrhosis. Abdominal pain. Follow-up study. EXAM: MRI ABDOMEN WITHOUT AND WITH CONTRAST TECHNIQUE: Multiplanar multisequence MR imaging of the abdomen was performed both before and after the administration of intravenous contrast. CONTRAST:  7 mL of Gadavist. COMPARISON:  No prior abdominal MRI.  CT the abdomen and pelvis FINDINGS: Lower chest: Multiple small pulmonary nodules are noted in the visualize lung bases measuring up to 8 mm in the right lower lobe. Hepatobiliary: Liver has a nodular contour, indicative of underlying cirrhosis. There  are multiple heterogeneous appearing hepatic lesions scattered throughout the liver, most evident in the right lobe of the liver. The largest of these is centered in segment 8 near the dome (axial image 18 of series 15) measuring 6.6 x 6.1 cm. This lesion is heterogeneous in signal intensity on T1 and T2 weighted images, but generally mildly T1 hyperintense and T2 hypointense with mild heterogeneous arterial phase hyperenhancement and some minimal delayed washout. Several other lesions are noted, including a small lesion in between segments 6 and 7 (axial image 34 of series 15) measuring 3.4 x 2.9 cm which demonstrates isointense to mildly hyperintense T1 signal intensity, T2 hypointensity, and demonstrates arterial phase hyperenhancement with definitive delayed washout. Innumerable other smaller  lesions are also noted. The portal vein appears expanded, likely by tumor thrombus. This extends into both the left and right branches of the portal vein. The left branch remains partially patent, while the right branches completely occluded. No intra or extrahepatic biliary ductal dilatation. Gallbladder is unremarkable in appearance. Pancreas: No pancreatic mass. No pancreatic ductal dilatation. Small amount of peripancreatic fluid, without a well-defined organized fluid collection. Spleen:  Unremarkable. Adrenals/Urinary Tract: Bilateral kidneys and adrenal glands are normal in appearance. No hydroureteronephrosis in the visualized portions of the abdomen. Stomach/Bowel: Visualized portions are unremarkable. Vascular/Lymphatic: Aortic atherosclerosis, without definite aneurysm in the abdominal vasculature. Portal vein thrombus, as detailed above. 9 mm short axis gastrohepatic ligament lymph node (axial image 32 of series 15) which demonstrates some mild diffusion restriction, suspicious for metastatic lymph node. Other:  Moderate volume of ascites. Musculoskeletal: No aggressive appearing osseous lesions are noted in the  visualized portions of the skeleton. IMPRESSION: 1. Cirrhosis with multifocal infiltrative neoplasm most evident in the right lobe of the liver, as detailed above, most likely to reflect multifocal hepatocellular carcinoma. This is associated with probable tumor thrombus in the portal venous system, as detailed above. Widespread metastatic disease noted in the lung bases. Borderline enlarged gastrohepatic ligament lymph node demonstrating diffusion restriction also suspicious for nodal metastasis. 2. Moderate volume of ascites. 3. Additional incidental findings, as above. Electronically Signed   By: Vinnie Langton M.D.   On: 01/15/2019 12:22    ASSESSMENT & PLAN:   Cancer, hepatocellular ([place]) #Multiple liver masses-elevated alpha-fetoprotein 3 40,000; highly suggestive of hepatocellular cancer.  MRI July 23-imaging findings suggestive of hepatocellular cancer.  Follow-up biopsy at this time.  #Proceed with Tecentriq plus Avastin; as soon as possible [see discussion below].  COVID testing in the emergency room will expedite starting of treatment.  #Abdominal pain-secondary to malignancy; not enough ascitic fluid present to be drained.  Patient plans to start oxycodone tomorrow/paycheck.  # Diarrhea- loose stools "10 /day"-C. difficile testing/GI PCR negative.  Recommend COVID testing.  #I would recommend further MRI of the liver stat for further evaluation.  If inconclusive would recommend biopsy.  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 PT/PTT normal.  # Hepatitis C-untreated-positive for antibody awaiting genotyping.  #Overall prognosis is poor/will recommend palliative care evaluation at next visit.  # I reviewed the blood work- with the patient in detail; also reviewed the imaging independently [as summarized above]; and with the patient in detail.   # keep appts/follow up as planned- Dr.B    All questions were answered.  The patient knows to call the clinic with any problems, questions or concerns.    Cammie Sickle, MD 01/15/2019 3:02 PM

## 2019-01-15 NOTE — Telephone Encounter (Signed)
Spoke to pt's wife/pt- re: plan. Recommend going to ER for urgent evaluation of COVID given ongoing diarrhea. If negative will plan to start on chemo- Tecentriq+ avastin ASAP.  Abdominal pain improved after starting oxycodone. No plans paracentesis at this time.

## 2019-01-15 NOTE — Assessment & Plan Note (Addendum)
#  Multiple liver masses-elevated alpha-fetoprotein 3 40,000; highly suggestive of hepatocellular cancer.  MRI July 23-imaging findings suggestive of hepatocellular cancer.  Follow-up biopsy at this time.  #Proceed with Tecentriq plus Avastin; as soon as possible [see discussion below].  COVID testing in the emergency room will expedite starting of treatment.  #Abdominal pain-secondary to malignancy; not enough ascitic fluid present to be drained.  Patient plans to start oxycodone tomorrow/paycheck.  # Diarrhea- loose stools "10 /day"-C. difficile testing/GI PCR negative.  Recommend COVID testing.  #I would recommend further MRI of the liver stat for further evaluation.  If inconclusive would recommend biopsy.  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 PT/PTT normal.  # Hepatitis C-untreated-positive for antibody awaiting genotyping.  #Overall prognosis is poor/will recommend palliative care evaluation at next visit.  # I reviewed the blood work- with the patient in detail; also reviewed the imaging independently [as summarized above]; and with the patient in detail.   # keep appts/follow up as planned- Dr.B

## 2019-01-16 ENCOUNTER — Other Ambulatory Visit: Payer: Self-pay

## 2019-01-16 ENCOUNTER — Encounter: Payer: Self-pay | Admitting: Pharmacy Technician

## 2019-01-16 ENCOUNTER — Encounter: Payer: Self-pay | Admitting: Emergency Medicine

## 2019-01-16 ENCOUNTER — Emergency Department
Admission: EM | Admit: 2019-01-16 | Discharge: 2019-01-16 | Disposition: A | Discharge status: Home / Self Care | Payer: HRSA Program | Attending: Emergency Medicine | Admitting: Emergency Medicine

## 2019-01-16 DIAGNOSIS — Z20828 Contact with and (suspected) exposure to other viral communicable diseases: Secondary | ICD-10-CM | POA: Insufficient documentation

## 2019-01-16 DIAGNOSIS — R197 Diarrhea, unspecified: Secondary | ICD-10-CM | POA: Diagnosis not present

## 2019-01-16 DIAGNOSIS — Z79899 Other long term (current) drug therapy: Secondary | ICD-10-CM | POA: Diagnosis not present

## 2019-01-16 DIAGNOSIS — C229 Malignant neoplasm of liver, not specified as primary or secondary: Secondary | ICD-10-CM | POA: Diagnosis not present

## 2019-01-16 HISTORY — DX: Malignant (primary) neoplasm, unspecified: C80.1

## 2019-01-16 NOTE — ED Provider Notes (Signed)
[facility] Emergency Department Provider Note  ____________________________________________   I have reviewed the triage vital signs and the nursing notes. Where available I have reviewed prior notes and, if possible and indicated, outside hospital notes.   Patient seen and evaluated during the coronavirus epidemic during a time with low staffing  Patient seen for the symptoms described in the history of present illness. She was evaluated in the context of the global COVID-19 pandemic, which necessitated consideration that the patient might be at risk for infection with the SARS-CoV-2 virus that causes COVID-19. Institutional protocols and algorithms that pertain to the evaluation of patients at risk for COVID-19 are in a state of rapid change based on information released by regulatory bodies including the CDC and federal and state organizations. These policies and algorithms were followed during the patient's care in the ED.    HISTORY  Chief Complaint Abdominal Pain    HPI Jacob Wilcox is a 63 y.o. male  Who is here for a COVID test.  Has a history of liver cancer, with diarrhea.  Has had diarrhea for nearly a month.  Has had extensive work-up by his oncologist including stool samples and blood work and these work-ups have been unremarkable.  It is been decided that the patient needs a coronavirus test and it is been decided that the emergency department is the best place for him to get this in a convenient time frame.  The patient is in no acute distress.  He states he feels the same as he always does.  He does not want other blood work or other work-up as he is already had that.  He just wants a COVID test.  Patient did have an MRI yesterday.  Past Medical History:  Diagnosis Date  . Cancer ([place])    Stage 4 Liver  . Hepatitis C     Patient Active Problem List   Diagnosis Date Noted  . Goals of care, counseling/discussion 01/14/2019  . Cancer,  hepatocellular ([place]) 01/13/2019    Past Surgical History:  Procedure Laterality Date  . HERNIA REPAIR     not sure what it was--when he was a baby    Prior to Admission medications   Medication Sig Start Date End Date Taking? Authorizing Provider  Loperamide HCl (IMODIUM A-D PO) Take by mouth daily as needed.    [provider]  oxyCODONE (OXY IR/ROXICODONE) 5 MG immediate release tablet Take 1 tablet (5 mg total) by mouth every 6 (six) hours as needed for severe pain. 01/13/19   Cammie Sickle, MD  traMADol (ULTRAM) 50 MG tablet Take 1 tablet (50 mg total) by mouth every 6 (six) hours as needed. Patient not taking: Reported on 01/13/2019 01/12/19 01/12/20  Lavonia Drafts, MD  vitamin B-12 (CYANOCOBALAMIN) 1000 MCG tablet Take 1,000 mcg by mouth daily.    [provider]    Allergies Poison ivy extract  History reviewed. No pertinent family history.  Social History Social History   Tobacco Use  . Smoking status: Never Smoker  . Smokeless tobacco: Never Used  Substance Use Topics  . Alcohol use: Yes  . Drug use: Never    Review of Systems Constitutional: No fever/chills Eyes: No visual changes. ENT: No sore throat. No stiff neck no neck pain Cardiovascular: Denies chest pain. Respiratory: Denies shortness of breath. Gastrointestinal:   no vomiting.  + diarrhea.  No constipation. Genitourinary: Negative for dysuria. Musculoskeletal: Negative lower extremity swelling Skin: Negative for rash. Neurological: Negative for  severe headaches, focal weakness or numbness.   ____________________________________________   PHYSICAL EXAM:  VITAL SIGNS: ED Triage Vitals  Enc Vitals Group     BP 01/16/19 0811 (!) 161/96     Pulse Rate 01/16/19 0811 93     Resp 01/16/19 0811 20     Temp 01/16/19 0807 98.2 F (36.8 C)     Temp Source 01/16/19 0807 Oral     SpO2 01/16/19 0811 97 %     Weight 01/16/19 0808 150 lb (68 kg)     Height 01/16/19 0808 5\' 10"   (1.778 m)     Head Circumference --      Peak Flow --      Pain Score 01/16/19 0807 1     Pain Loc --      Pain Edu? --      Excl. in [place]? --     Constitutional: Alert and oriented. Well appearing and in no acute distress. HEENT: No congestion/rhinnorhea. Mucous membranes are moist.  Oropharynx non-erythematous Neck:   Nontender with no meningismus, no masses, no stridor Cardiovascular: Normal rate, regular rhythm. Grossly normal heart sounds.  Good peripheral circulation. Respiratory: Normal respiratory effort.  No retractions. Lungs CTAB. Abdominal: Soft and nontender. No distention. No guarding no rebound Musculoskeletal: No lower extremity tenderness, no upper extremity tenderness  Neurologic:  Normal speech and language. No gross focal neurologic deficits are appreciated.  Skin:  Skin is warm, dry and intact. No rash noted. Psychiatric: Mood and affect are normal. Speech and behavior are normal.  ____________________________________________   LABS (all labs ordered are listed, but only abnormal results are displayed)  Labs Reviewed  NOVEL CORONAVIRUS, NAA (HOSPITAL ORDER, SEND-OUT TO REF LAB)    Pertinent labs  results that were available during my care of the patient were reviewed by me and considered in my medical decision making (see chart for details). ____________________________________________  EKG  I personally interpreted any EKGs ordered by me or triage  ____________________________________________  RADIOLOGY  Pertinent labs & imaging results that were available during my care of the patient were reviewed by me and considered in my medical decision making (see chart for details). If possible, patient and/or family made aware of any abnormal findings.   ____________________________________________    PROCEDURES  Procedure(s) performed: None  Procedures  Critical Care performed: None  ____________________________________________   INITIAL  IMPRESSION / ASSESSMENT AND PLAN / ED COURSE  Pertinent labs & imaging results that were available during my care of the patient were reviewed by me and considered in my medical decision making (see chart for details).   Patient with chronic diarrhea history of cancer and cirrhosis here for a COVID test.  We are not actually an outpatient covered testing center, however, we will send a cover test for him to see if we can facilitate his care.  Patient would prefer not to have other work-up at this time, and again there is no acute or emergent issue that he is complaining of and return precautions follow-up given and understood.    ____________________________________________   FINAL CLINICAL IMPRESSION(S) / ED DIAGNOSES  Final diagnoses:  None      This chart was dictated using voice recognition software.  Despite best efforts to proofread,  errors can occur which can change meaning.      Schuyler Amor, MD 01/16/19 [phone]

## 2019-01-16 NOTE — ED Notes (Signed)
First Nurse Note: Pt states that this cancer doctor sent him to ED for COVID test because he has been having problems with his stomach and they cannot figure out what it is.

## 2019-01-16 NOTE — Progress Notes (Signed)
Patient has been approved for drug assistance by [place] for Tecentriq & Avastin. The enrollment period is from 01/14/19-01/13/20 based on self pay. First DOS covered is 01/15/19.

## 2019-01-16 NOTE — ED Notes (Signed)
NAD noted at time of D/C. Pt denies questions or concerns. Pt ambulatory to the lobby at this time. Pt refused wheelchair to the lobby.  

## 2019-01-16 NOTE — ED Triage Notes (Signed)
Pt to ED from home c/o abd pain that has continued for several weeks and was seen recently and dx Monday with stage 4 liver cancer.  States still having diarrhea x1 today, nausea without vomiting today.  States [facility] wanted him to come here to be tested for COVID.  Pt appears in NAD, ambulatory with steady gait.

## 2019-01-17 LAB — NOVEL CORONAVIRUS, NAA (HOSP ORDER, SEND-OUT TO REF LAB; TAT 18-24 HRS): SARS-CoV-2, NAA: NOT DETECTED

## 2019-01-20 ENCOUNTER — Ambulatory Visit: Payer: Self-pay

## 2019-01-20 ENCOUNTER — Telehealth: Payer: Self-pay | Admitting: *Deleted

## 2019-01-20 ENCOUNTER — Ambulatory Visit: Payer: Self-pay | Admitting: Internal Medicine

## 2019-01-20 NOTE — Telephone Encounter (Signed)
Patient called the cancer center for the nurse/md team to review his covid-19 testing results. Call returned per his request. rn Explained to patient that his results were negative. Pt is very anxious. He reports that he is technically still supposed to self quarentine until the first day for this new start chemotherapy infusion even with negative covid testing results. He has no food in the house. His "wife is an invalid. I'm the only one that can drive. Barnabas Lister has helped me pay for groceries at  [employer] and I have to go pick up the groceries that the cancer center helped me get. I also need new shoes. My feet are so swollen. I can only fit in bedroom slippers." I spoke with Dr. Rogue Bussing. Md ok with patient going to get his groceries and attend to his daily needs if needed - given pt's situation. Advised pt to use standard precautions/protection against covid-19 including social distances and hand washing. Pt gave verbal understanding. He was encouraged to elevate his lower extremities as much as possible. Patient stated that his feet are slightly red. No signs of weeping of fluid.  I offered him an apt in [facility], but patient refused. He would like to have Dr. B just evaluate him on Thursday for his scheduled apt. He thanked me for following up with this phone call.

## 2019-01-21 LAB — HCV RNA QUANT RFLX ULTRA OR GENOTYP
HCV RNA Qnt(log copy/mL): 4.556 log10 IU/mL
HepC Qn: 36000 IU/mL

## 2019-01-21 LAB — HEPATITIS C GENOTYPE: Hepatitis C Genotype: 3

## 2019-01-22 ENCOUNTER — Other Ambulatory Visit: Payer: Self-pay

## 2019-01-22 ENCOUNTER — Inpatient Hospital Stay: Payer: Medicaid Other

## 2019-01-22 ENCOUNTER — Inpatient Hospital Stay (HOSPITAL_BASED_OUTPATIENT_CLINIC_OR_DEPARTMENT_OTHER): Payer: Medicaid Other | Admitting: Internal Medicine

## 2019-01-22 ENCOUNTER — Inpatient Hospital Stay (HOSPITAL_BASED_OUTPATIENT_CLINIC_OR_DEPARTMENT_OTHER): Payer: Medicaid Other | Admitting: Oncology

## 2019-01-22 ENCOUNTER — Encounter: Payer: Self-pay | Admitting: Internal Medicine

## 2019-01-22 VITALS — BP 123/79 | HR 96

## 2019-01-22 VITALS — BP 125/79 | HR 118 | Temp 99.6°F | Resp 20 | Ht 70.0 in | Wt 160.0 lb

## 2019-01-22 DIAGNOSIS — Z79899 Other long term (current) drug therapy: Secondary | ICD-10-CM

## 2019-01-22 DIAGNOSIS — Z5112 Encounter for antineoplastic immunotherapy: Secondary | ICD-10-CM | POA: Diagnosis not present

## 2019-01-22 DIAGNOSIS — R5381 Other malaise: Secondary | ICD-10-CM

## 2019-01-22 DIAGNOSIS — Z9221 Personal history of antineoplastic chemotherapy: Secondary | ICD-10-CM

## 2019-01-22 DIAGNOSIS — C220 Liver cell carcinoma: Secondary | ICD-10-CM

## 2019-01-22 DIAGNOSIS — R140 Abdominal distension (gaseous): Secondary | ICD-10-CM

## 2019-01-22 DIAGNOSIS — R5383 Other fatigue: Secondary | ICD-10-CM

## 2019-01-22 DIAGNOSIS — R600 Localized edema: Secondary | ICD-10-CM

## 2019-01-22 DIAGNOSIS — R634 Abnormal weight loss: Secondary | ICD-10-CM

## 2019-01-22 DIAGNOSIS — R630 Anorexia: Secondary | ICD-10-CM

## 2019-01-22 DIAGNOSIS — R109 Unspecified abdominal pain: Secondary | ICD-10-CM

## 2019-01-22 DIAGNOSIS — R110 Nausea: Secondary | ICD-10-CM

## 2019-01-22 DIAGNOSIS — K7460 Unspecified cirrhosis of liver: Secondary | ICD-10-CM

## 2019-01-22 DIAGNOSIS — F17210 Nicotine dependence, cigarettes, uncomplicated: Secondary | ICD-10-CM

## 2019-01-22 DIAGNOSIS — R21 Rash and other nonspecific skin eruption: Secondary | ICD-10-CM

## 2019-01-22 DIAGNOSIS — R160 Hepatomegaly, not elsewhere classified: Secondary | ICD-10-CM

## 2019-01-22 DIAGNOSIS — G893 Neoplasm related pain (acute) (chronic): Secondary | ICD-10-CM

## 2019-01-22 DIAGNOSIS — B1920 Unspecified viral hepatitis C without hepatic coma: Secondary | ICD-10-CM

## 2019-01-22 DIAGNOSIS — R197 Diarrhea, unspecified: Secondary | ICD-10-CM

## 2019-01-22 MED ORDER — SODIUM CHLORIDE 0.9 % IV SOLN
Freq: Once | INTRAVENOUS | Status: AC
  Administered 2019-01-22: 10:00:00 via INTRAVENOUS
  Filled 2019-01-22: qty 250

## 2019-01-22 MED ORDER — OXYCODONE HCL 5 MG PO TABS: 5.0000 mg | ORAL_TABLET | Freq: Four times a day (QID) | ORAL | 0 refills | Status: DC | PRN

## 2019-01-22 MED ORDER — POTASSIUM CHLORIDE CRYS ER 20 MEQ PO TBCR: EXTENDED_RELEASE_TABLET | ORAL | 3 refills | Status: AC

## 2019-01-22 MED ORDER — FUROSEMIDE 40 MG PO TABS: 40.0000 mg | ORAL_TABLET | Freq: Every day | ORAL | 2 refills | Status: DC

## 2019-01-22 MED ORDER — FENTANYL 50 MCG/HR TD PT72: 1.0000 | MEDICATED_PATCH | TRANSDERMAL | 0 refills | Status: DC

## 2019-01-22 MED ORDER — SODIUM CHLORIDE 0.9 % IV SOLN
1000.0000 mg | Freq: Once | INTRAVENOUS | Status: AC
  Administered 2019-01-22: 1000 mg via INTRAVENOUS
  Filled 2019-01-22: qty 32

## 2019-01-22 MED ORDER — SODIUM CHLORIDE 0.9 % IV SOLN
1200.0000 mg | Freq: Once | INTRAVENOUS | Status: AC
  Administered 2019-01-22: 1200 mg via INTRAVENOUS
  Filled 2019-01-22: qty 20

## 2019-01-22 MED ORDER — PROCHLORPERAZINE MALEATE 10 MG PO TABS: 10.0000 mg | ORAL_TABLET | Freq: Four times a day (QID) | ORAL | 1 refills | Status: AC | PRN

## 2019-01-22 NOTE — Progress Notes (Signed)
[facility] CONSULT NOTE  Patient Care Team: Patient, No Pcp Per as PCP - General (General Practice) Clent Jacks, RN as Oncology Nurse Navigator  CHIEF COMPLAINTS/PURPOSE OF CONSULTATION: Liver cancer  #  Oncology History Overview Note  # July 2020-multiple liver lesions; lung lesions-stage IV/[place] stage C AFP-340,000; July 23 MRI liver-multifocal; 6 x 6 dome of liver; # ~3 x 3 cm suggestive of HCC; no biopsy;   #July30th, 2020-Tecentriq plus Avastin  #Cirrhosis-child Pugh C decompensated-Lasix potassium  # hep C [untreated]/alcohol  # Early 1990s- Hep C [needle sharing >35 years in [place]; no treatment.     DIAGNOSIS: [place]  STAGE:    IV     ;GOALS: palliative  CURRENT/MOST RECENT THERAPY : Tecentriq plus Avastin    Cancer, hepatocellular ([place])  01/13/2019 Initial Diagnosis   Cancer, hepatocellular ([place])   01/22/2019 -  Chemotherapy   The patient had bevacizumab (AVASTIN) 1,000 mg in sodium chloride 0.9 % 100 mL chemo infusion, 1,075 mg, Intravenous,  Once, 1 of 6 cycles Administration: 1,000 mg (01/22/2019) atezolizumab (TECENTRIQ) 1,200 mg in sodium chloride 0.9 % 250 mL chemo infusion, 1,200 mg, Intravenous, Once, 1 of 6 cycles Administration: 1,200 mg (01/22/2019)  for chemotherapy treatment.       HISTORY OF PRESENTING ILLNESS:  Jacob Wilcox 63 y.o.  male patient with metastatic [place] is here to proceed with treatment.  Patient was interim seen in the emergency room; COVID testing negative.  He states his diarrhea is improved.  However complains of worsening abdominal discomfort/bloating.  However patient notes to have pain improving taking oxycodone.  He has 1 oxycodone left today.  Also notes to have swelling in the legs.   Overall continues to feel poorly.  Poor appetite.  Positive for nausea.  No vomiting.  Also complains of itchy rash on his body/torso.  Review of Systems  Constitutional: Positive for malaise/fatigue and weight loss.  Negative for chills, diaphoresis and fever.  HENT: Negative for nosebleeds and sore throat.   Eyes: Negative for double vision.  Respiratory: Negative for cough, hemoptysis, sputum production, shortness of breath and wheezing.   Cardiovascular: Positive for leg swelling. Negative for chest pain, palpitations and orthopnea.  Gastrointestinal: Positive for abdominal pain and nausea. Negative for blood in stool, constipation, heartburn and melena.  Genitourinary: Negative for dysuria, frequency and urgency.  Musculoskeletal: Positive for back pain and joint pain.  Skin: Positive for itching and rash.  Neurological: Negative for dizziness, tingling, focal weakness, weakness and headaches.  Endo/Heme/Allergies: Does not bruise/bleed easily.  Psychiatric/Behavioral: Negative for depression. The patient is not nervous/anxious and does not have insomnia.      MEDICAL HISTORY:  Past Medical History:  Diagnosis Date  . Cancer ([place])    Stage 4 Liver  . Hepatitis C     SURGICAL HISTORY: Past Surgical History:  Procedure Laterality Date  . HERNIA REPAIR     not sure what it was--when he was a baby    SOCIAL HISTORY: Social History   Socioeconomic History  . Marital status: Married    Spouse name: Not on file  . Number of children: Not on file  . Years of education: Not on file  . Highest education level: Not on file  Occupational History  . Not on file  Social Needs  . Financial resource strain: Not on file  . Food insecurity    Worry: Not on file    Inability: Not on file  . Transportation needs  Medical: Not on file    Non-medical: Not on file  Tobacco Use  . Smoking status: Never Smoker  . Smokeless tobacco: Never Used  Substance and Sexual Activity  . Alcohol use: Yes  . Drug use: Never  . Sexual activity: Not on file  Lifestyle  . Physical activity    Days per week: Not on file    Minutes per session: Not on file  . Stress: Not on file  Relationships  . Social  Herbalist on phone: Not on file    Gets together: Not on file    Attends religious service: Not on file    Active member of club or organization: Not on file    Attends meetings of clubs or organizations: Not on file    Relationship status: Not on file  . Intimate partner violence    Fear of current or ex partner: Not on file    Emotionally abused: Not on file    Physically abused: Not on file    Forced sexual activity: Not on file  Other Topics Concern  . Not on file  Social History Narrative   Lives in [place]; with wife; Engineer, drilling; smoke 1ppd x 30 years; alcohol 6 pack beer/day.     FAMILY HISTORY: History reviewed. No pertinent family history.  ALLERGIES:  is allergic to poison ivy extract.  MEDICATIONS:  Current Outpatient Medications  Medication Sig Dispense Refill  . Loperamide HCl (IMODIUM A-D PO) Take by mouth daily as needed.    Marland Kitchen oxyCODONE (OXY IR/ROXICODONE) 5 MG immediate release tablet Take 1 tablet (5 mg total) by mouth every 6 (six) hours as needed for severe pain. 45 tablet 0  . vitamin B-12 (CYANOCOBALAMIN) 1000 MCG tablet Take 1,000 mcg by mouth daily.    . fentaNYL (DURAGESIC) 50 MCG/HR Place 1 patch onto the skin every 3 (three) days. 10 patch 0  . furosemide (LASIX) 40 MG tablet Take 1 tablet (40 mg total) by mouth daily. 30 tablet 2  . potassium chloride SA (K-DUR) 20 MEQ tablet 1 pill twice a day 30 tablet 3  . prochlorperazine (COMPAZINE) 10 MG tablet Take 1 tablet (10 mg total) by mouth every 6 (six) hours as needed for nausea or vomiting. 40 tablet 1   No current facility-administered medications for this visit.       Marland Kitchen  PHYSICAL EXAMINATION: ECOG PERFORMANCE STATUS: 1 - Symptomatic but completely ambulatory  Vitals:   01/22/19 0906  BP: 125/79  Pulse: (!) 118  Resp: 20  Temp: 99.6 F (37.6 C)  SpO2: 95%   Filed Weights   01/22/19 0906  Weight: 160 lb (72.6 kg)    Physical Exam  Constitutional: He is oriented to person,  place, and time and well-developed, well-nourished, and in no distress.  Mild distress because of abdominal cramping.  Alone.  Poorly kept.   HENT:  Head: Normocephalic and atraumatic.  Mouth/Throat: Oropharynx is clear and moist. No oropharyngeal exudate.  Eyes: Pupils are equal, round, and reactive to light.  Neck: Normal range of motion. Neck supple.  Cardiovascular: Normal rate and regular rhythm.  Pulmonary/Chest: No respiratory distress. He has no wheezes.  Decreased air entry bilaterally.  Abdominal: Soft. Bowel sounds are normal. He exhibits distension. He exhibits no mass. There is abdominal tenderness. There is no rebound and no guarding.  Positive for hepatomegaly/up to the umbilicus.  No significant ascitic fluid noted.  Musculoskeletal: Normal range of motion.  General: Edema present. No tenderness.  Neurological: He is alert and oriented to person, place, and time.  Skin: Skin is warm.  Multiple scabbing lesions anterior chest wall.  No signs of infection.  Psychiatric: Affect normal.     LABORATORY DATA:  I have reviewed the data as listed Lab Results  Component Value Date   WBC 5.6 01/12/2019   HGB 13.1 01/12/2019   HCT 37.5 (L) 01/12/2019   MCV 98.9 01/12/2019   PLT 98 (L) 01/12/2019   Recent Labs    01/12/19 0911  NA 134*  K 4.0  CL 98  CO2 27  GLUCOSE 106*  BUN 17  CREATININE 0.42*  CALCIUM 9.5  GFRNONAA >60  GFRAA >60  PROT 7.5  ALBUMIN 2.7*  AST 177*  ALT 67*  ALKPHOS 197*  BILITOT 2.1*    RADIOGRAPHIC STUDIES: I have personally reviewed the radiological images as listed and agreed with the findings in the report. Ct Abdomen Pelvis W Contrast  Result Date: 01/12/2019 CLINICAL DATA:  Abdominal distension, lower abdominal pain EXAM: CT ABDOMEN AND PELVIS WITH CONTRAST TECHNIQUE: Multidetector CT imaging of the abdomen and pelvis was performed using the standard protocol following bolus administration of intravenous contrast. CONTRAST:   142mL OMNIPAQUE IOHEXOL 300 MG/ML  SOLN COMPARISON:  None. FINDINGS: Lower chest: Numerous bilateral pulmonary nodules seen in the lower lungs. Index right lower lobe nodule on image 1 measures 9 mm. Index left lower lobe nodule on image 4 measures 7 mm. No effusions. Heart is normal size. Hepatobiliary: Nodular contours within the liver compatible with cirrhosis. There are numerous ill-defined low-density masses in the liver concerning for malignancy. Index lesion in the hepatic dome measures approximately 5.4 cm. Ill-defined low-density lesions throughout the right hepatic lobe predominantly. Filling defect in the main portal vein and left portal vein compatible with partially occlusive thrombus. Right portal vein is not visualized, likely occluded. Pancreas: No focal abnormality or ductal dilatation. Spleen: No focal abnormality.  Normal size. Adrenals/Urinary Tract: No adrenal abnormality. No focal renal abnormality. No stones or hydronephrosis. Urinary bladder is unremarkable. Stomach/Bowel: Stomach, large and small bowel grossly unremarkable. Vascular/Lymphatic: Aortic atherosclerosis. No enlarged abdominal or pelvic lymph nodes. Reproductive: No visible focal abnormality. Other: Moderate free fluid in the pelvis and around the liver. No free air. Musculoskeletal: No acute bony abnormality. IMPRESSION: Nodular contours of the liver with enlargement of the left hepatic lobe compatible with cirrhosis. Extensive ill-defined low-density areas predominantly throughout the right lobe of the liver. It is difficult to determine if this is primary hepatocellular carcinoma or metastases. 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 Right portal vein is not visualized and may be occluded. Numerous bilateral lower lobe lung pulmonary nodules compatible with metastases. Moderate ascites around the liver and in the pelvis. Aortic atherosclerosis. Electronically Signed   By: Rolm Baptise M.D.    On: 01/12/2019 10:45   Mr Liver W Wo Contrast  Result Date: 01/15/2019 CLINICAL DATA:  63 year old male with liver lesion noted on prior CT examination. History of hepatitis C and cirrhosis. Abdominal pain. Follow-up study. EXAM: MRI ABDOMEN WITHOUT AND WITH CONTRAST TECHNIQUE: Multiplanar multisequence MR imaging of the abdomen was performed both before and after the administration of intravenous contrast. CONTRAST:  7 mL of Gadavist. COMPARISON:  No prior abdominal MRI.  CT the abdomen and pelvis FINDINGS: Lower chest: Multiple small pulmonary nodules are noted in the visualize lung bases measuring up to 8 mm in the right lower lobe.  Hepatobiliary: Liver has a nodular contour, indicative of underlying cirrhosis. There are multiple heterogeneous appearing hepatic lesions scattered throughout the liver, most evident in the right lobe of the liver. The largest of these is centered in segment 8 near the dome (axial image 18 of series 15) measuring 6.6 x 6.1 cm. This lesion is heterogeneous in signal intensity on T1 and T2 weighted images, but generally mildly T1 hyperintense and T2 hypointense with mild heterogeneous arterial phase hyperenhancement and some minimal delayed washout. Several other lesions are noted, including a small lesion in between segments 6 and 7 (axial image 34 of series 15) measuring 3.4 x 2.9 cm which demonstrates isointense to mildly hyperintense T1 signal intensity, T2 hypointensity, and demonstrates arterial phase hyperenhancement with definitive delayed washout. Innumerable other smaller lesions are also noted. The portal vein appears expanded, likely by tumor thrombus. This extends into both the left and right branches of the portal vein. The left branch remains partially patent, while the right branches completely occluded. No intra or extrahepatic biliary ductal dilatation. Gallbladder is unremarkable in appearance. Pancreas: No pancreatic mass. No pancreatic ductal dilatation. Small  amount of peripancreatic fluid, without a well-defined organized fluid collection. Spleen:  Unremarkable. Adrenals/Urinary Tract: Bilateral kidneys and adrenal glands are normal in appearance. No hydroureteronephrosis in the visualized portions of the abdomen. Stomach/Bowel: Visualized portions are unremarkable. Vascular/Lymphatic: Aortic atherosclerosis, without definite aneurysm in the abdominal vasculature. Portal vein thrombus, as detailed above. 9 mm short axis gastrohepatic ligament lymph node (axial image 32 of series 15) which demonstrates some mild diffusion restriction, suspicious for metastatic lymph node. Other:  Moderate volume of ascites. Musculoskeletal: No aggressive appearing osseous lesions are noted in the visualized portions of the skeleton. IMPRESSION: 1. Cirrhosis with multifocal infiltrative neoplasm most evident in the right lobe of the liver, as detailed above, most likely to reflect multifocal hepatocellular carcinoma. This is associated with probable tumor thrombus in the portal venous system, as detailed above. Widespread metastatic disease noted in the lung bases. Borderline enlarged gastrohepatic ligament lymph node demonstrating diffusion restriction also suspicious for nodal metastasis. 2. Moderate volume of ascites. 3. Additional incidental findings, as above. Electronically Signed   By: Vinnie Langton M.D.   On: 01/15/2019 12:22    ASSESSMENT & PLAN:   Cancer, hepatocellular ([place]) #Stage IV /hepatocellular cancer-Barcelona stage-C/HCC.  Alpha-fetoprotein 3 40,000.  Discussed the tumor conference.  #Proceed with Tecentriq plus Avastin today.  Labs reviewed.  Again reviewed with the patient treatments are palliative not curative.   Again reviewed the mechanism of action and potential side effects of Avastin/immunotherapy in detail. After lengthy discussion weighing risk versus benefits patient agrees to proceed with treatment as discussed above.   #Cirrhosis child Pugh  C/decompensated-ascites/leg swelling-start Lasix/potassium.  If abdominal discomfort is worse would recommend thoracentesis.  #Abdominal pain-continue oxycodone 5 mg every 6 hours.  #Discussed overall prognosis of hepatocellular cancer is poor; unfortunately given patient decompensated cirrhosis/extremely elevated alpha-fetoprotein/aggressive malignancy-it is quite unlikely patient will live up to the median survival 1 to 2 years.   #Discussed palliation of symptoms/follow-up with primary care.  Offered to talk to his wife-he declines.  DISPOSITION:  # Avastin+ tecentriq today # Palliative care follow up in 1 week; labs- cbc/cmp # follow up in 3 weeks-MD; labs- Tecentriq+ avastin; cbc/cmp;UA- Dr.B  # 40 minutes face-to-face with the patient discussing the above plan of care; more than 50% of time spent on prognosis/ natural history; counseling and coordination.    All questions were answered. The patient  knows to call the clinic with any problems, questions or concerns.    Cammie Sickle, MD 01/22/2019 8:15 PM

## 2019-01-22 NOTE — Progress Notes (Signed)
Patient taking oxycodone 5 mg tablets every 4-5 hours PRN.  It was prescribed to be taken 5 mg tablets every 6 hours PRN for pain.  Unfortunately, he has run out of his medication early and is unable to fill sooner than tomorrow.  Given he is maxing out on his short acting medications, we will add a long-acting narcotic in hopes to cut back on short acting medication.  Patient in agreement with plan.  RX 50 MCG/Hr fentanyl patch every 72 hours.   He will pick up his new prescription of 5 mg tablets every 6 hours PRN for pain.  I will touch base with him tomorrow to review new prescription.   PMP reviewed.   Faythe Casa, NP 01/22/2019 4:25 PM

## 2019-01-22 NOTE — Assessment & Plan Note (Addendum)
#  Stage IV /hepatocellular cancer-B[place] stage-C/HCC.  Alpha-fetoprotein 3 40,000.  Discussed the tumor conference.  #Proceed with Tecentriq plus Avastin today.  Labs reviewed.  Again reviewed with the patient treatments are palliative not curative.   Again reviewed the mechanism of action and potential side effects of Avastin/immunotherapy in detail. After lengthy discussion weighing risk versus benefits patient agrees to proceed with treatment as discussed above.   #Cirrhosis child Pugh C/decompensated-ascites/leg swelling-start Lasix/potassium.  If abdominal discomfort is worse would recommend thoracentesis.  #Abdominal pain-continue oxycodone 5 mg every 6 hours.  #Discussed overall prognosis of hepatocellular cancer is poor; unfortunately given patient decompensated cirrhosis/extremely elevated alpha-fetoprotein/aggressive malignancy-it is quite unlikely patient will live up to the median survival 1 to 2 years.   #Discussed palliation of symptoms/follow-up with primary care.  Offered to talk to his wife-he declines.  DISPOSITION:  # Avastin+ tecentriq today # Palliative care follow up in 1 week; labs- cbc/cmp # follow up in 3 weeks-MD; labs- Tecentriq+ avastin; cbc/cmp;UA- Dr.B  # 40 minutes face-to-face with the patient discussing the above plan of care; more than 50% of time spent on prognosis/ natural history; counseling and coordination.

## 2019-01-22 NOTE — Progress Notes (Signed)
Per MD treatment parameters okay to proceed with 01/12/19 labs and HR of 118. Labs do not include [place] Per Heather RN per Dr. Rogue Bussing okay to proceed with treatment, no ANC needed at this time.    Pt said that [facility] is refusing to refill his prescriptions until tomorrow.... he is staying that he has none left at home, but does have the old prescription that did not work (but unsure which medication it is), he is talking about doubling up on that one, encouraged pt to take medications as prescribed and educated pt on dangers of taking medication differently than prescribed. Dr. Rogue Bussing and Sonia Baller NP aware and Sonia Baller to come to chairside to speak with pt.   1240: Pt treatment complete and refuses to wait for NP to report to chairside. States "she can call me, [phone]. Pt educated on importance of answering the phone when she calls. Pt verbalizes understanding.  Pt and VS stable at discharge.

## 2019-01-22 NOTE — Progress Notes (Signed)
Tumor Board Documentation  Jacob Wilcox was presented by Dr Rogue Bussing at our Tumor Board on 01/22/2019, which included representatives from medical oncology, radiation oncology, surgical, radiology, pathology, genetics, pharmacy, internal medicine, navigation, research, palliative care, pulmonology.  Jacob Wilcox currently presents as a current patient with history of the following treatments: active survellience.  Additionally, we reviewed previous medical and familial history, history of present illness, and recent lab results along with all available histopathologic and imaging studies. The tumor board considered available treatment options and made the following recommendations: Immunotherapy    The following procedures/referrals were also placed: No orders of the defined types were placed in this encounter.   Clinical Trial Status: not discussed   Staging used: Clinical Stage  AJCC Staging: T: 4 N: 1 M: 1 Group: Stage 4 Heptocellular Carcinoma   National site-specific guidelines   were discussed with respect to the case.  Tumor board is a meeting of clinicians from various specialty areas who evaluate and discuss patients for whom a multidisciplinary approach is being considered. Final determinations in the plan of care are those of the provider(s). The responsibility for follow up of recommendations given during tumor board is that of the provider.   Today's extended care, comprehensive team conference, Jacob Wilcox was not present for the discussion and was not examined.   Multidisciplinary Tumor Board is a multidisciplinary case peer review process.  Decisions discussed in the Multidisciplinary Tumor Board reflect the opinions of the specialists present at the conference without having examined the patient.  Ultimately, treatment and diagnostic decisions rest with the primary provider(s) and the patient.

## 2019-01-22 NOTE — Progress Notes (Signed)
[place]  Telephone:([phone] [phone]  Patient Care Team: Patient, No Pcp Per as PCP - General (General Practice) Clent Jacks, RN as Oncology Nurse Navigator   Name of the patient: Jacob Wilcox  [id number]  [birth year]   Date of visit: 02/16/19  Diagnosis-stage IV hepatocellular carcinoma.  Chief complaint/Reason for visit- Initial Meeting for [facility], preparing for starting chemotherapy  Heme/Onc history:  Oncology History Overview Note  # July 2020-multiple liver lesions; lung lesions-stage IV/[place] stage C AFP-340,000; July 23 MRI liver-multifocal; 6 x 6 dome of liver; # ~3 x 3 cm suggestive of HCC; no biopsy;   #July30th, 2020-Tecentriq plus Avastin x1; STOPPED sec to liver decompensation  #Cirrhosis-child Pugh C decompensated-Lasix potassium  # hep C [untreated]/alcohol  # Early 1990s- Hep C [needle sharing >35 years in [place]; no treatment.     DIAGNOSIS: [place]  STAGE:    IV     ;GOALS: palliative  CURRENT/MOST RECENT THERAPY : Hospice    Cancer, hepatocellular ([place])  01/13/2019 Initial Diagnosis   Cancer, hepatocellular ([place])   01/22/2019 -  Chemotherapy   The patient had bevacizumab (AVASTIN) 1,000 mg in sodium chloride 0.9 % 100 mL chemo infusion, 1,075 mg, Intravenous,  Once, 1 of 6 cycles Administration: 1,000 mg (01/22/2019) atezolizumab (TECENTRIQ) 1,200 mg in sodium chloride 0.9 % 250 mL chemo infusion, 1,200 mg, Intravenous, Once, 1 of 6 cycles Administration: 1,200 mg (01/22/2019)  for chemotherapy treatment.      Interval history-63 year old male who presents to chemo care clinic today for initial meeting in preparation for starting chemotherapy. I introduced the chemo care clinic and we discussed that the role of the clinic is to assist those who are at an increased risk of emergency room visits and/or complications during the course of chemotherapy treatment. We  discussed that the increased risk takes into account factors such as age, performance status, and co-morbidities. We also discussed that for some, this might include barriers to care such as not having a primary care provider, lack of insurance/transportation, or not being able to afford medications. We discussed that the goal of the program is to help prevent unplanned ER visits and help reduce complications during chemotherapy. We do this by discussing specific risk factors to each individual and identifying ways that we can help improve these risk factors and reduce barriers to care.   ECOG FS:1 - Symptomatic but completely ambulatory  Review of systems- Review of Systems  Constitutional: Negative.  Negative for chills, fever, malaise/fatigue and weight loss.  HENT: Negative for congestion, ear pain and tinnitus.   Eyes: Negative.  Negative for blurred vision and double vision.  Respiratory: Negative.  Negative for cough, sputum production and shortness of breath.   Cardiovascular: Negative.  Negative for chest pain, palpitations and leg swelling.  Gastrointestinal: Negative.  Negative for abdominal pain, constipation, diarrhea, nausea and vomiting.  Genitourinary: Negative for dysuria, frequency and urgency.  Musculoskeletal: Positive for back pain and myalgias. Negative for falls.  Skin: Negative.  Negative for rash.  Neurological: Negative.  Negative for weakness and headaches.  Endo/Heme/Allergies: Negative.  Does not bruise/bleed easily.  Psychiatric/Behavioral: Negative.  Negative for depression. The patient is not nervous/anxious and does not have insomnia.      Current treatment-scheduled to begin cycle 1 today with Tecentriq and Avastin.  Allergies  Allergen Reactions   Fentanyl Other (See Comments)    Altered mental status   Poison Ivy Extract  Past Medical History:  Diagnosis Date   Cancer ([place])    Stage 4 Liver   Hepatitis C     Past Surgical History:    Procedure Laterality Date   HERNIA REPAIR     not sure what it was--when he was a baby    Social History   Socioeconomic History   Marital status: Married    Spouse name: Not on file   Number of children: Not on file   Years of education: Not on file   Highest education level: Not on file  Occupational History   Not on file  Social Needs   Financial resource strain: Not on file   Food insecurity    Worry: Not on file    Inability: Not on file   Transportation needs    Medical: Not on file    Non-medical: Not on file  Tobacco Use   Smoking status: Never Smoker   Smokeless tobacco: Never Used  Substance and Sexual Activity   Alcohol use: Yes   Drug use: Never   Sexual activity: Not on file  Lifestyle   Physical activity    Days per week: Not on file    Minutes per session: Not on file   Stress: Not on file  Relationships   Social connections    Talks on phone: Not on file    Gets together: Not on file    Attends religious service: Not on file    Active member of club or organization: Not on file    Attends meetings of clubs or organizations: Not on file    Relationship status: Not on file   Intimate partner violence    Fear of current or ex partner: Not on file    Emotionally abused: Not on file    Physically abused: Not on file    Forced sexual activity: Not on file  Other Topics Concern   Not on file  Social History Narrative   Lives in [place]; with wife; Engineer, drilling; smoke 1ppd x 30 years; alcohol 6 pack beer/day.     No family history on file.   Current Outpatient Medications:    escitalopram (LEXAPRO) 10 MG tablet, Take 0.5 tablets (5 mg total) by mouth daily., Disp: 30 tablet, Rfl: 0   fentaNYL (DURAGESIC) 25 MCG/HR, Place 1 patch onto the skin every 3 (three) days., Disp: 5 patch, Rfl: 0   furosemide (LASIX) 40 MG tablet, Take 1 tablet (40 mg total) by mouth daily., Disp: 30 tablet, Rfl: 2   Loperamide HCl (IMODIUM A-D PO), Take  by mouth daily as needed., Disp: , Rfl:    naloxone (NARCAN) nasal spray 4 mg/0.1 mL, See instrucitons (Patient not taking: Reported on 02/12/2019), Disp: 1 each, Rfl: 0   oxyCODONE (OXY IR/ROXICODONE) 5 MG immediate release tablet, Take 1 tablet (5 mg total) by mouth every 6 (six) hours as needed for severe pain., Disp: 60 tablet, Rfl: 0   polyethylene glycol (MIRALAX) 17 g packet, Take 17 g by mouth daily., Disp: 14 each, Rfl: 0   potassium chloride SA (K-DUR) 20 MEQ tablet, 1 pill twice a day, Disp: 30 tablet, Rfl: 3   prochlorperazine (COMPAZINE) 10 MG tablet, Take 1 tablet (10 mg total) by mouth every 6 (six) hours as needed for nausea or vomiting., Disp: 40 tablet, Rfl: 1   senna (SENOKOT) 8.6 MG TABS tablet, Take 1 tablet (8.6 mg total) by mouth daily., Disp: 120 tablet, Rfl: 0   sulfamethoxazole-trimethoprim (BACTRIM) 400-80 MG  tablet, Take 1 tablet by mouth 2 (two) times daily., Disp: 20 tablet, Rfl: 0   vitamin B-12 (CYANOCOBALAMIN) 1000 MCG tablet, Take 1,000 mcg by mouth daily., Disp: , Rfl:  No current facility-administered medications for this visit.   Facility-Administered Medications Ordered in Other Visits:    0.9 %  sodium chloride infusion, , Intravenous, Continuous, Kassidy Frankson, Wandra Feinstein, NP, Stopped at 02/06/19 1427   morphine 2 MG/ML injection 1 mg, 1 mg, Intravenous, Once, Jacquelin Hawking, NP  Physical exam: There were no vitals filed for this visit. Physical Exam Constitutional:      Appearance: Normal appearance.  HENT:     Head: Normocephalic and atraumatic.  Eyes:     Pupils: Pupils are equal, round, and reactive to light.  Neck:     Musculoskeletal: Normal range of motion.  Cardiovascular:     Rate and Rhythm: Normal rate and regular rhythm.     Heart sounds: Normal heart sounds. No murmur.  Pulmonary:     Effort: Pulmonary effort is normal.     Breath sounds: Normal breath sounds. No wheezing.  Abdominal:     General: Bowel sounds are normal. There  is no distension.     Palpations: Abdomen is soft.     Tenderness: There is abdominal tenderness in the epigastric area.  Musculoskeletal: Normal range of motion.  Skin:    General: Skin is warm and dry.     Findings: No rash.  Neurological:     Mental Status: He is alert and oriented to person, place, and time.  Psychiatric:        Judgment: Judgment normal.      CMP Latest Ref Rng & Units 02/12/2019  Glucose 70 - 99 mg/dL 103(H)  BUN 8 - 23 mg/dL 17  Creatinine 0.61 - 1.24 mg/dL 0.58(L)  Sodium 135 - 145 mmol/L 132(L)  Potassium 3.5 - 5.1 mmol/L 4.2  Chloride 98 - 111 mmol/L 99  CO2 22 - 32 mmol/L 26  Calcium 8.9 - 10.3 mg/dL 8.9  Total Protein 6.5 - 8.1 g/dL 7.1  Total Bilirubin 0.3 - 1.2 mg/dL 2.8(H)  Alkaline Phos 38 - 126 U/L 256(H)  AST 15 - 41 U/L 192(H)  ALT 0 - 44 U/L 90(H)   CBC Latest Ref Rng & Units 02/12/2019  WBC 4.0 - 10.5 K/uL 4.6  Hemoglobin 13.0 - 17.0 g/dL 12.7(L)  Hematocrit 39.0 - 52.0 % 36.9(L)  Platelets 150 - 400 K/uL 41(L)    No images are attached to the encounter.  US Paracentesis  Result Date: 02/16/2019 INDICATION: 63 year old with hepatocellular carcinoma and ascites. EXAM: ULTRASOUND GUIDED PARACENTESIS MEDICATIONS: None. COMPLICATIONS: None immediate. PROCEDURE: Informed written consent was obtained from the patient after a discussion of the risks, benefits and alternatives to treatment. A timeout was performed prior to the initiation of the procedure. Initial ultrasound scanning demonstrates a large amount of ascites within the right lower abdominal quadrant. The right lower abdomen was prepped and draped in the usual sterile fashion. 1% lidocaine was used for local anesthesia. Following this, a 6 Fr Safe-T-Centesis catheter was introduced. An ultrasound image was saved for documentation purposes. The paracentesis was performed. The catheter was removed and a dressing was applied. The patient tolerated the procedure well without immediate post  procedural complication. FINDINGS: A total of approximately 3 L of amber fluid was removed. IMPRESSION: Successful ultrasound-guided paracentesis yielding 3 liters of peritoneal fluid. Electronically Signed   By: Markus Daft M.D.   On: 02/16/2019 14:56  US Paracentesis  Result Date: 02/10/2019 INDICATION: History of cirrhosis and multifocal hepatocellular carcinoma with recurrent symptomatic intra-abdominal ascites. Please perform ultrasound-guided paracentesis for therapeutic purposes. EXAM: ULTRASOUND-GUIDED PARACENTESIS COMPARISON:  Multiple previous ultrasound-guided paracenteses, most recently on 02/06/2019 yielding 2.9 L of peritoneal fluid. MEDICATIONS: None. COMPLICATIONS: None immediate. TECHNIQUE: Informed written consent was obtained from the patient after a discussion of the risks, benefits and alternatives to treatment. A timeout was performed prior to the initiation of the procedure. Initial ultrasound scanning demonstrates a small to moderate amount of ascites within the right mid hemiabdomen. The right mid hemiabdomen was prepped and draped in the usual sterile fashion. 1% lidocaine with epinephrine was used for local anesthesia. Under direct ultrasound guidance, a 19 gauge, 7-cm, Yueh catheter was introduced. An ultrasound image was saved for documentation purposed. The paracentesis was performed. The catheter was removed and a dressing was applied. The patient tolerated the procedure well without immediate post procedural complication. FINDINGS: A total of approximately 2.6 liters of serous, slightly blood tinged peritoneal fluid was removed. IMPRESSION: Successful ultrasound-guided paracentesis yielding 2.6 liters of peritoneal fluid. Electronically Signed   By: Sandi Mariscal M.D.   On: 02/10/2019 11:20   US Paracentesis  Result Date: 02/06/2019 INDICATION: Hepatocellular carcinoma and reaccumulation of malignant ascites with abdominal distension and pain. EXAM: ULTRASOUND GUIDED  PARACENTESIS MEDICATIONS: None. COMPLICATIONS: None immediate. PROCEDURE: Informed written consent was obtained from the patient after a discussion of the risks, benefits and alternatives to treatment. A timeout was performed prior to the initiation of the procedure. Initial ultrasound was performed to localize ascites. The right lower abdomen was prepped and draped in the usual sterile fashion. 1% lidocaine was used for local anesthesia. Following this, a 6 Fr Safe-T-Centesis catheter was introduced. An ultrasound image was saved for documentation purposes. The paracentesis was performed. The catheter was removed and a dressing was applied. The patient tolerated the procedure well without immediate post procedural complication. FINDINGS: A total of approximately 2.9 L of blood tinged fluid was removed. IMPRESSION: Successful ultrasound-guided paracentesis yielding 2.9 liters of peritoneal fluid. Electronically Signed   By: Aletta Edouard M.D.   On: 02/06/2019 11:23   US Paracentesis  Result Date: 01/30/2019 INDICATION: Recent diagnosis of cirrhosis and multifocal hepatocellular carcinoma, now with symptomatic intra-abdominal ascites. Please perform ultrasound-guided paracentesis for diagnostic and therapeutic purposes. EXAM: ULTRASOUND-GUIDED PARACENTESIS COMPARISON:  CT abdomen pelvis-01/12/2019; abdominal MRI-01/15/2019 MEDICATIONS: None. COMPLICATIONS: None immediate. TECHNIQUE: Informed written consent was obtained from the patient after a discussion of the risks, benefits and alternatives to treatment. A timeout was performed prior to the initiation of the procedure. Initial ultrasound scanning demonstrates a small to moderate amount of ascites within the right lower abdominal quadrant. The right lower abdomen was prepped and draped in the usual sterile fashion. 1% lidocaine with epinephrine was used for local anesthesia. Under direct ultrasound guidance, a 19 gauge, 7-cm, Yueh catheter was introduced. An  ultrasound image was saved for documentation purposed. The paracentesis was performed. The catheter was removed and a dressing was applied. The patient tolerated the procedure well without immediate post procedural complication. FINDINGS: A total of approximately 1.6 liters of serous fluid was removed. Samples were sent to the laboratory as requested by the clinical team. IMPRESSION: Successful ultrasound-guided paracentesis yielding 1.6 liters of peritoneal fluid. Electronically Signed   By: Sandi Mariscal M.D.   On: 01/30/2019 11:50     Assessment and plan- Patient is a 63 y.o. male who presents to [facility] for initial  meeting in preparation for starting chemotherapy for the treatment of stage IV hepatocellular carcinoma.   1.Cancer-stage IV hepatocellular carcinoma.  Patient with initial diagnosis after being evaluated in the emergency room in July 2020 for complaints of abdominal discomfort and distention with bloating for approximately 1 month.  Has history of hepatitis C untreated.  He reported diarrhea 8-10 loose stools daily, abdominal cramps and poor appetite.  He was found to be positive for extreme weight loss and fatigue.  Work-up included an MRI with biopsy for diagnosis and chemotherapy with Tecentriq plus Avastin every 3 weeks.  MRI of abdomen (01/15/19) revealed cirrhosis with multifactorial infiltrates neoplasm most evident in the right lobe of the liver.  This most likely would reflect multifocal hepatocellular carcinoma.  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  Widespread metastatic disease noted in the lung bases and enlarged gastrohepatic ligament lymph node.  He had a moderate amount of fluid ascites.  2. Chemo Care Clinic/High Risk for ER/Hospitalization during chemotherapy- We discussed the role of the chemo care clinic and identified patient specific risk factors. I discussed that patient was identified as high risk primarily based on lack of health  insurance and primary care provider.  He also has metastatic cancer.  He lives at home with his wife.  They both are unemployed.  His wife is in school.  Has history of hepatitis C that is untreated.  Otherwise he does not have a significant health history.  3. Social Determinants of Health- we discussed that social determinants of health may have significant impacts on health and outcomes for cancer patients.  Today we discussed specific social determinants of performance status, alcohol use, depression, financial needs, food insecurity, housing, interpersonal violence, social connections, stress, tobacco use, and transportation.  After lengthy discussion the following were identified as areas of need:  Mr. Berg has already been in touch with [facility] for financial insecurity help with medications, [employer] and our [employer]. We discussed that living with cancer can create tremendous financial burden.  We discussed options for assistance. I asked that if assistance is needed in affording medications or paying bills to please let us know so that we can provide assistance. We discussed options for food including social services.  We will also notify [facility] to see if cancer center can provide support.  Patient informed of food pantry at cancer center and was provided with care package today.  Please notify nursing if un-met needs.  Patient has history of substance abuse in the past.  He is currently still drinking alcohol several beers daily.  Patient is currently not working due to fatigue and pain from his cancer.  His wife is also not working but is in school.  He states "I have no money in the bank account".  He drives himself to and from his appointments. We discussed options for transportation including [employer], paratransit, bus routes, [employer] transit, taxi/uber/lyft, and cancer center van.  I have notified primary oncology team who will help assist with arranging Lucianne Lei transportation for  appointments when needed. We also discussed options for transportation on short notice/acute visits.   4. Co-morbidities Complicating Care: No PCP, No health insurance.   5. Palliative Care- based on stage of cancer and/or identified needs today, I will refer patient to palliative care for goals of care and advanced care planning.  We also discussed the role of the Symptom Management Clinic at [facility] for acute issues and methods of contacting  clinic/provider. He denies needing specific assistance at this time and He will be followed by Renita Papa, RN.    Visit Diagnosis 1. Cancer, hepatocellular ([place])    Patient expressed understanding and was in agreement with this plan. He also understands that He can call clinic at any time with any questions, concerns, or complaints.   A total of (25) minutes of face-to-face time was spent with this patient with greater than 50% of that time in counseling and care-coordination.  Faythe Casa, AGNP-C [facility] (work cell) [phone] (office)  CC: Dr. Rogue Bussing

## 2019-01-22 NOTE — Patient Instructions (Signed)
The following medication prescriptions were sent to your pharmacy: potassium supplement tablet, lasix (your fluid retention pill) compazine (your nausea tablet) . Please pick these prescriptions up at your pharmacy.  Please take your potassium supplement with a full glass of water to prevent nausea and gastric discomfort.  Do not take potassium on an empty stomach.   Potassium chloride tablets, extended-release tablets or capsules What is this medicine? POTASSIUM CHLORIDE (poe TASS i um KLOOR ide) is a potassium supplement used to prevent and to treat low potassium. Potassium is important for the heart, muscles, and nerves. Too much or too little potassium in the body can cause serious problems. This medicine may be used for other purposes; ask your health care provider or pharmacist if you have questions. COMMON BRAND NAME(S): ED-K+10, K-10, K-8, K-Dur, K-Tab, Kaon-CL, Klor-Con, Klor-Con M10, Klor-Con M15, Klor-Con M20, Klotrix, Micro-K, Micro-K Extencaps, Slow-K What should I tell my health care provider before I take this medicine? They need to know if you have any of these conditions:  Addison's disease  dehydration  diabetes  difficulty swallowing  heart disease  high levels of potassium in the blood  irregular heartbeat  kidney disease  recent severe burn  stomach ulcers or other stomach problems  an unusual or allergic reaction to potassium, tartrazine, other medicines, foods, dyes, or preservatives  pregnant or trying to get pregnant  breast-feeding How should I use this medicine? Take this medicine by mouth with a full glass of water. Take with food. Follow the directions on the prescription label. Do not suck on, crush, or chew this medicine. If you have difficulty swallowing, ask the pharmacist how to take. Take your medicine at regular intervals. Do not take it more often than directed. Do not stop taking except on your doctor's advice. Talk to your pediatrician  regarding the use of this medicine in children. Special care may be needed. Overdosage: If you think you have taken too much of this medicine contact a poison control center or emergency room at once. NOTE: This medicine is only for you. Do not share this medicine with others. What if I miss a dose? If you miss a dose, take it as soon as you can. If it is almost time for your next dose, take only that dose. Do not take double or extra doses. What may interact with this medicine? Do not take this medicine with any of the following medications:  certain diuretics such as spironolactone, triamterene  certain medicines for stomach problems like atropine; difenoxin and glycopyrrolate  eplerenone  sodium polystyrene sulfonate This medicine may also interact with the following medications:  certain medicines for blood pressure or heart disease like lisinopril, losartan, quinapril, valsartan  medicines that lower your chance of fighting infection such as cyclosporine, tacrolimus  NSAIDs, medicines for pain and inflammation, like ibuprofen or naproxen  other potassium supplements  salt substitutes This list may not describe all possible interactions. Give your health care provider a list of all the medicines, herbs, non-prescription drugs, or dietary supplements you use. Also tell them if you smoke, drink alcohol, or use illegal drugs. Some items may interact with your medicine. What should I watch for while using this medicine? Visit your doctor or health care professional for regular check ups. You will need lab work done regularly. You may need to be on a special diet while taking this medicine. Ask your doctor. What side effects may I notice from receiving this medicine? Side effects that you should report  to your doctor or health care professional as soon as possible:  allergic reactions like skin rash, itching or hives, swelling of the face, lips, or tongue  black, tarry  stools  breathing problems  confusion  heartburn  fast, irregular heartbeat  feeling faint or lightheaded, falls  low blood pressure  numbness or tingling in hands or feet  pain when swallowing  unusually weak or tired  weakness, heaviness of legs Side effects that usually do not require medical attention (report to your doctor or health care professional if they continue or are bothersome):  diarrhea  nausea, vomiting  stomach pain This list may not describe all possible side effects. Call your doctor for medical advice about side effects. You may report side effects to FDA at 1-800-FDA-1088. Where should I keep my medicine? Keep out of the reach of children. Store at room temperature between 15 and 30 degrees C (59 and 86 degrees F ). Keep bottle closed tightly to protect this medicine from light and moisture. Throw away any unused medicine after the expiration date. NOTE: This sheet is a summary. It may not cover all possible information. If you have questions about this medicine, talk to your doctor, pharmacist, or health care provider.  2020 Elsevier/Gold Standard (2016-03-14 11:43:27)      Furosemide tablets What is this medicine? FUROSEMIDE (fyoor [place] se mide) is a diuretic. It helps you make more urine and to lose salt and excess water from your body. This medicine is used to treat high blood pressure, and edema or swelling from heart, kidney, or liver disease. This medicine may be used for other purposes; ask your health care provider or pharmacist if you have questions. COMMON BRAND NAME(S): Active-Medicated Specimen Kit, Delone, Diuscreen, Lasix, RX Specimen Collection Kit, Specimen Collection Kit, URINX Medicated Specimen Collection What should I tell my health care provider before I take this medicine? They need to know if you have any of these conditions:  abnormal blood electrolytes  diarrhea or vomiting  gout  heart disease  kidney disease, small  amounts of urine, or difficulty passing urine  liver disease  thyroid disease  an unusual or allergic reaction to furosemide, sulfa drugs, other medicines, foods, dyes, or preservatives  pregnant or trying to get pregnant  breast-feeding How should I use this medicine? Take this medicine by mouth with a glass of water. Follow the directions on the prescription label. You may take this medicine with or without food. If it upsets your stomach, take it with food or milk. Do not take your medicine more often than directed. Remember that you will need to pass more urine after taking this medicine. Do not take your medicine at a time of day that will cause you problems. Do not take at bedtime. Talk to your pediatrician regarding the use of this medicine in children. While this drug may be prescribed for selected conditions, precautions do apply. Overdosage: If you think you have taken too much of this medicine contact a poison control center or emergency room at once. NOTE: This medicine is only for you. Do not share this medicine with others. What if I miss a dose? If you miss a dose, take it as soon as you can. If it is almost time for your next dose, take only that dose. Do not take double or extra doses. What may interact with this medicine?  aspirin and aspirin-like medicines  certain antibiotics  chloral hydrate  cisplatin  cyclosporine  digoxin  diuretics  laxatives  lithium  medicines for blood pressure  medicines that relax muscles for surgery  methotrexate  NSAIDs, medicines for pain and inflammation like ibuprofen, naproxen, or indomethacin  phenytoin  steroid medicines like prednisone or cortisone  sucralfate  thyroid hormones This list may not describe all possible interactions. Give your health care provider a list of all the medicines, herbs, non-prescription drugs, or dietary supplements you use. Also tell them if you smoke, drink alcohol, or use illegal  drugs. Some items may interact with your medicine. What should I watch for while using this medicine? Visit your doctor or health care provider for regular checks on your progress. Check your blood pressure regularly. Ask your doctor or health care provider what your blood pressure should be, and when you should contact him or her. If you are a diabetic, check your blood sugar as directed. This medicine may cause serious skin reactions. They can happen weeks to months after starting the medicine. Contact your health care provider right away if you notice fevers or flu-like symptoms with a rash. The rash may be red or purple and then turn into blisters or peeling of the skin. Or, you might notice a red rash with swelling of the face, lips or lymph nodes in your neck or under your arms. You may need to be on a special diet while taking this medicine. Check with your doctor. Also, ask how many glasses of fluid you need to drink a day. You must not get dehydrated. You may get drowsy or dizzy. Do not drive, use machinery, or do anything that needs mental alertness until you know how this drug affects you. Do not stand or sit up quickly, especially if you are an older patient. This reduces the risk of dizzy or fainting spells. Alcohol can make you more drowsy and dizzy. Avoid alcoholic drinks. This medicine can make you more sensitive to the sun. Keep out of the sun. If you cannot avoid being in the sun, wear protective clothing and use sunscreen. Do not use sun lamps or tanning beds/booths. What side effects may I notice from receiving this medicine? Side effects that you should report to your doctor or health care professional as soon as possible:  blood in urine or stools  dry mouth  fever or chills  hearing loss or ringing in the ears  irregular heartbeat  muscle pain or weakness, cramps  rash, fever, and swollen lymph nodes  redness, blistering, peeling or loosening of the skin, including  inside the mouth  skin rash  stomach upset, pain, or nausea  tingling or numbness in the hands or feet  unusually weak or tired  vomiting or diarrhea  yellowing of the eyes or skin Side effects that usually do not require medical attention (report to your doctor or health care professional if they continue or are bothersome):  headache  loss of appetite  unusual bleeding or bruising This list may not describe all possible side effects. Call your doctor for medical advice about side effects. You may report side effects to FDA at 1-800-FDA-1088. Where should I keep my medicine? Keep out of the reach of children. Store at room temperature between 15 and 30 degrees C (59 and 86 degrees F). Protect from light. Throw away any unused medicine after the expiration date. NOTE: This sheet is a summary. It may not cover all possible information. If you have questions about this medicine, talk to your doctor, pharmacist, or health care provider.  2020 Elsevier/Gold  Standard (2018-09-12 14:04:13)      Prochlorperazine tablets What is this medicine? PROCHLORPERAZINE (proe klor PER a zeen) helps to control severe nausea and vomiting. This medicine is also used to treat schizophrenia. It can also help patients who experience anxiety that is not due to psychological illness. This medicine may be used for other purposes; ask your health care provider or pharmacist if you have questions. COMMON BRAND NAME(S): Compazine What should I tell my health care provider before I take this medicine? They need to know if you have any of these conditions:  blood disorders or disease  dementia  diabetes  liver disease or jaundice  Parkinson's disease  uncontrollable movement disorder  an unusual or allergic reaction to prochlorperazine, other medicines, foods, dyes, or preservatives  pregnant or trying to get pregnant  breast-feeding How should I use this medicine? Take this medicine by  mouth with a glass of water. Follow the directions on the prescription label. Take your doses at regular intervals. Do not take your medicine more often than directed. Do not stop taking this medicine suddenly. This can cause nausea, vomiting, and dizziness. Ask your doctor or health care professional for advice. Talk to your pediatrician regarding the use of this medicine in children. Special care may be needed. While this drug may be prescribed for children as young as 2 years for selected conditions, precautions do apply. Overdosage: If you think you have taken too much of this medicine contact a poison control center or emergency room at once. NOTE: This medicine is only for you. Do not share this medicine with others. What if I miss a dose? If you miss a dose, take it as soon as you can. If it is almost time for your next dose, take only that dose. Do not take double or extra doses. What may interact with this medicine? Do not take this medicine with any of the following medications:  amoxapine  antidepressants like citalopram, escitalopram, fluoxetine, paroxetine, and sertraline  deferoxamine  maprotiline  tricyclic antidepressants like amitriptyline, clomipramine, imipramine, nortiptyline and others This medicine may also interact with the following medications:  dofetilide  lithium  medicines for pain  phenytoin  propranolol  warfarin This list may not describe all possible interactions. Give your health care provider a list of all the medicines, herbs, non-prescription drugs, or dietary supplements you use. Also tell them if you smoke, drink alcohol, or use illegal drugs. Some items may interact with your medicine. What should I watch for while using this medicine? Visit your doctor or health care professional for regular checks on your progress. You may get drowsy or dizzy. Do not drive, use machinery, or do anything that needs mental alertness until you know how this  medicine affects you. Do not stand or sit up quickly, especially if you are an older patient. This reduces the risk of dizzy or fainting spells. Alcohol may interfere with the effect of this medicine. Avoid alcoholic drinks. This medicine can reduce the response of your body to heat or cold. Dress warm in cold weather and stay hydrated in hot weather. If possible, avoid extreme temperatures like saunas, hot tubs, very hot or cold showers, or activities that can cause dehydration such as vigorous exercise. This medicine may increase blood sugar. Ask your healthcare provider if changes in diet or medicines are needed if you have diabetes. This medicine can make you more sensitive to the sun. Keep out of the sun. If you cannot avoid being in the  sun, wear protective clothing and use sunscreen. Do not use sun lamps or tanning beds/booths. Your mouth may get dry. Chewing sugarless gum or sucking hard candy, and drinking plenty of water may help. Contact your doctor if the problem does not go away or is severe. What side effects may I notice from receiving this medicine? Side effects that you should report to your doctor or health care professional as soon as possible:  blurred vision  breast enlargement in men or women  breast milk in women who are not breast-feeding  chest pain, fast or irregular heartbeat  confusion, restlessness  dark yellow or brown urine  difficulty breathing or swallowing  dizziness or fainting spells  drooling, shaking, movement difficulty (shuffling walk) or rigidity  fever, chills, sore throat  involuntary or uncontrollable movements of the eyes, mouth, head, arms, and legs  seizures   signs and symptoms of high blood sugar such as being more thirsty or hungry or having to urinate more than normal. You may also feel very tired or have blurry vision.  stomach area pain  unusually weak or tired  unusual bleeding or bruising  yellowing of skin or eyes Side  effects that usually do not require medical attention (report to your doctor or health care professional if they continue or are bothersome):  difficulty passing urine  difficulty sleeping  headache  sexual dysfunction  skin rash, or itching This list may not describe all possible side effects. Call your doctor for medical advice about side effects. You may report side effects to FDA at 1-800-FDA-1088. Where should I keep my medicine? Keep out of the reach of children. Store at room temperature between 15 and 30 degrees C (59 and 86 degrees F). Protect from light. Throw away any unused medicine after the expiration date. NOTE: This sheet is a summary. It may not cover all possible information. If you have questions about this medicine, talk to your doctor, pharmacist, or health care provider.  2020 Elsevier/Gold Standard (2018-06-03 08:37:35)

## 2019-01-22 NOTE — Progress Notes (Signed)
Patient here for for infusion- avastin + Tencentriq.  Reinforncement Education was provided by RN and MD team regarding avastin + tencentriq and supportive services. Patient is highly anxious. 4+ pitting edema present in bilaterally lower extremities. Patient reports intermittent (1 to 2) loose stools daily. (Gi/diff negative).  Education provided on compazine, lasix and potassium supplement. Written information also provided to patient as well as verbal instructions.

## 2019-01-23 ENCOUNTER — Other Ambulatory Visit: Payer: Self-pay | Admitting: Oncology

## 2019-01-23 MED ORDER — OXYCODONE HCL 5 MG PO TABS: 5.0000 mg | ORAL_TABLET | Freq: Four times a day (QID) | ORAL | 0 refills | Status: DC | PRN

## 2019-01-23 MED ORDER — FENTANYL 25 MCG/HR TD PT72: 1.0000 | MEDICATED_PATCH | TRANSDERMAL | 0 refills | Status: DC

## 2019-01-23 NOTE — Progress Notes (Signed)
RX send to [facility] pharmacy per [facility].   Patient unable to afford medications- [facility] paying for medications.  Faythe Casa, NP 01/23/2019 12:51 PM

## 2019-01-24 ENCOUNTER — Telehealth: Payer: Self-pay | Admitting: Oncology

## 2019-01-24 NOTE — Telephone Encounter (Signed)
Wife called and reports that patient recently started on Fentanyl patch 57mcg on 01/24/2019, used oxycodone 5mg  yesterday 3pm and took another one this morning 7AM. Patient felt "zoomed out", confused for a while. Mental status gets better now, but not at his baseline yet.  Breathing is fine.  Advise patient to peel off fentanyl patch immediately, monitor mental status. If he improves and return to baseline, he may just use Oxycodone as instructed for pain control during this weekend. Communicated with Dr.Brahmanday next week and have his pain regimen adjusted.consider  Fentanyl dose decrease. If mental status continues to worsen, advise patient to go to ER for further evaluation. Wife voices understanding the plan.

## 2019-01-26 ENCOUNTER — Encounter: Payer: Self-pay | Admitting: *Deleted

## 2019-01-26 ENCOUNTER — Telehealth: Payer: Self-pay

## 2019-01-26 NOTE — Telephone Encounter (Signed)
Thanks, Zhou.  [facility]/Palliative care- please have pt follow up with one of you re: adjusting pt's pain medications. Thanks, GB

## 2019-01-26 NOTE — Telephone Encounter (Signed)
Telephone call to patient for follow up after first infusion he received on 01/22/2019.  Patient states swelling in the legs are much better.  States no problems currently and pain much more under control.  States will be here Thursday to see [employer].  Encouraged patient to call for any questions or concerns.

## 2019-01-26 NOTE — Telephone Encounter (Signed)
Patient is controlled with the oxycodone. He does not desire an apt today in the [facility]. He rather wait until Thursday with his sch. Apt with Josh. He "no longer feels loopy, but rather fatigued."  Pt states that his family member reminded him that he had a previous issue with fentanyl after a surgery many years ago.

## 2019-01-29 ENCOUNTER — Telehealth: Payer: Self-pay | Admitting: *Deleted

## 2019-01-29 ENCOUNTER — Other Ambulatory Visit: Payer: Self-pay | Admitting: *Deleted

## 2019-01-29 ENCOUNTER — Inpatient Hospital Stay (HOSPITAL_BASED_OUTPATIENT_CLINIC_OR_DEPARTMENT_OTHER): Payer: Medicaid Other | Admitting: Nurse Practitioner

## 2019-01-29 ENCOUNTER — Inpatient Hospital Stay: Payer: Medicaid Other | Attending: Internal Medicine

## 2019-01-29 ENCOUNTER — Encounter: Payer: Self-pay | Admitting: Nurse Practitioner

## 2019-01-29 ENCOUNTER — Other Ambulatory Visit: Payer: Self-pay

## 2019-01-29 VITALS — BP 129/67 | HR 103 | Temp 97.8°F | Resp 18 | Wt 160.0 lb

## 2019-01-29 DIAGNOSIS — R918 Other nonspecific abnormal finding of lung field: Secondary | ICD-10-CM | POA: Diagnosis not present

## 2019-01-29 DIAGNOSIS — E860 Dehydration: Secondary | ICD-10-CM | POA: Insufficient documentation

## 2019-01-29 DIAGNOSIS — E871 Hypo-osmolality and hyponatremia: Secondary | ICD-10-CM | POA: Diagnosis not present

## 2019-01-29 DIAGNOSIS — I951 Orthostatic hypotension: Secondary | ICD-10-CM | POA: Diagnosis not present

## 2019-01-29 DIAGNOSIS — C220 Liver cell carcinoma: Secondary | ICD-10-CM | POA: Diagnosis not present

## 2019-01-29 DIAGNOSIS — K5903 Drug induced constipation: Secondary | ICD-10-CM | POA: Diagnosis not present

## 2019-01-29 DIAGNOSIS — Z9221 Personal history of antineoplastic chemotherapy: Secondary | ICD-10-CM | POA: Diagnosis not present

## 2019-01-29 DIAGNOSIS — Z515 Encounter for palliative care: Secondary | ICD-10-CM | POA: Insufficient documentation

## 2019-01-29 DIAGNOSIS — R531 Weakness: Secondary | ICD-10-CM | POA: Insufficient documentation

## 2019-01-29 DIAGNOSIS — Z79899 Other long term (current) drug therapy: Secondary | ICD-10-CM | POA: Insufficient documentation

## 2019-01-29 DIAGNOSIS — K7460 Unspecified cirrhosis of liver: Secondary | ICD-10-CM | POA: Insufficient documentation

## 2019-01-29 DIAGNOSIS — K7030 Alcoholic cirrhosis of liver without ascites: Secondary | ICD-10-CM

## 2019-01-29 DIAGNOSIS — G893 Neoplasm related pain (acute) (chronic): Secondary | ICD-10-CM

## 2019-01-29 DIAGNOSIS — R634 Abnormal weight loss: Secondary | ICD-10-CM | POA: Insufficient documentation

## 2019-01-29 DIAGNOSIS — Z66 Do not resuscitate: Secondary | ICD-10-CM | POA: Insufficient documentation

## 2019-01-29 DIAGNOSIS — B1920 Unspecified viral hepatitis C without hepatic coma: Secondary | ICD-10-CM | POA: Diagnosis not present

## 2019-01-29 DIAGNOSIS — R188 Other ascites: Secondary | ICD-10-CM

## 2019-01-29 DIAGNOSIS — R5381 Other malaise: Secondary | ICD-10-CM | POA: Insufficient documentation

## 2019-01-29 DIAGNOSIS — I8510 Secondary esophageal varices without bleeding: Secondary | ICD-10-CM

## 2019-01-29 DIAGNOSIS — R5383 Other fatigue: Secondary | ICD-10-CM | POA: Insufficient documentation

## 2019-01-29 DIAGNOSIS — I81 Portal vein thrombosis: Secondary | ICD-10-CM | POA: Insufficient documentation

## 2019-01-29 DIAGNOSIS — R180 Malignant ascites: Secondary | ICD-10-CM | POA: Insufficient documentation

## 2019-01-29 LAB — COMPREHENSIVE METABOLIC PANEL
ALT: 48 U/L — ABNORMAL HIGH (ref 0–44)
AST: 164 U/L — ABNORMAL HIGH (ref 15–41)
Albumin: 2.7 g/dL — ABNORMAL LOW (ref 3.5–5.0)
Alkaline Phosphatase: 181 U/L — ABNORMAL HIGH (ref 38–126)
Anion gap: 9 (ref 5–15)
BUN: 10 mg/dL (ref 8–23)
CO2: 30 mmol/L (ref 22–32)
Calcium: 8.9 mg/dL (ref 8.9–10.3)
Chloride: 90 mmol/L — ABNORMAL LOW (ref 98–111)
Creatinine, Ser: 0.71 mg/dL (ref 0.61–1.24)
GFR calc Af Amer: >60 mL/min (ref >60)
GFR calc non Af Amer: >60 mL/min (ref >60)
Glucose, Bld: 134 mg/dL — ABNORMAL HIGH (ref 70–99)
Potassium: 3.6 mmol/L (ref 3.5–5.1)
Sodium: 129 mmol/L — ABNORMAL LOW (ref 135–145)
Total Bilirubin: 1.9 mg/dL — ABNORMAL HIGH (ref 0.3–1.2)
Total Protein: 7.1 g/dL (ref 6.5–8.1)

## 2019-01-29 LAB — CBC WITH DIFFERENTIAL/PLATELET
Abs Immature Granulocytes: 0.01 10*3/uL (ref 0.00–0.07)
Basophils Absolute: 0 10*3/uL (ref 0.0–0.1)
Basophils Relative: 0 %
Eosinophils Absolute: 0.1 10*3/uL (ref 0.0–0.5)
Eosinophils Relative: 3 %
HCT: 34.3 % — ABNORMAL LOW (ref 39.0–52.0)
Hemoglobin: 11.8 g/dL — ABNORMAL LOW (ref 13.0–17.0)
Immature Granulocytes: 0 %
Lymphocytes Relative: 18 %
Lymphs Abs: 0.6 10*3/uL — ABNORMAL LOW (ref 0.7–4.0)
MCH: 33.9 pg (ref 26.0–34.0)
MCHC: 34.4 g/dL (ref 30.0–36.0)
MCV: 98.6 fL (ref 80.0–100.0)
Monocytes Absolute: 0.3 10*3/uL (ref 0.1–1.0)
Monocytes Relative: 9 %
Neutro Abs: 2.2 10*3/uL (ref 1.7–7.7)
Neutrophils Relative %: 70 %
Platelets: 63 10*3/uL — ABNORMAL LOW (ref 150–400)
RBC: 3.48 MIL/uL — ABNORMAL LOW (ref 4.22–5.81)
RDW: 15.4 % (ref 11.5–15.5)
WBC: 3.2 10*3/uL — ABNORMAL LOW (ref 4.0–10.5)
nRBC: 0 % (ref 0.0–0.2)

## 2019-01-29 MED ORDER — FENTANYL 12 MCG/HR TD PT72: 1.0000 | MEDICATED_PATCH | TRANSDERMAL | 0 refills | Status: DC

## 2019-01-29 MED ORDER — NALOXONE HCL 4 MG/0.1ML NA LIQD: NASAL | 0 refills | Status: AC

## 2019-01-29 MED ORDER — NALOXONE HCL 4 MG/0.1ML NA LIQD: NASAL | 0 refills | Status: DC

## 2019-01-29 NOTE — Progress Notes (Signed)
[place]  [phone] [phone]  Name: Jacob Wilcox Date: 01/29/2019 MRN: [id number]  DOB: 03/29/1956  Patient Care Team: Patient, No Pcp Per as PCP - General (General Practice) Clent Jacks, RN as Oncology Nurse Navigator    REASON FOR CONSULTATION: Palliative Care consult requested for this 63 y.o. 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  Jacob Wilcox presented to ER in July 2020 for complaints of lower abdominal pain and distention.  CT was performed with findings concerning for metastatic hepatocellular carcinoma. Alpha-fetoprotein 3 was 40,000.  This was followed by MRI which also suggestive of hepatocellular carcinoma.  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  Jacob Wilcox initiated palliative Tecentriq and Avastin on 01/22/2019. Palliative care was consulted to discuss goals of care and ongoing symptom management.  SOCIAL HISTORY:     reports that Jacob Wilcox has never smoked. Jacob Wilcox has never used smokeless tobacco. Jacob Wilcox reports current alcohol use. Jacob Wilcox reports that Jacob Wilcox does not use drugs.  Moved from [place] with his wife approximately 1 year ago.  Has 2 children still living in [place].  Is currently living in his mother-in-law's home while she is in nursing facility.  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  ADVANCE DIRECTIVES:  Does not have  CODE STATUS: Not on file  PAST MEDICAL HISTORY: Past Medical History:  Diagnosis Date   Cancer ([place])    Stage 4 Liver   Hepatitis C     PAST SURGICAL HISTORY:  Past Surgical History:  Procedure Laterality Date   HERNIA REPAIR     not sure what it was--when Jacob Wilcox was a baby    HEMATOLOGY/ONCOLOGY HISTORY:  Oncology History Overview Note  # July 2020-multiple liver  lesions; lung lesions-stage IV/[place] stage C AFP-340,000; July 23 MRI liver-multifocal; 6 x 6 dome of liver; # ~3 x 3 cm suggestive of HCC; no biopsy;   #July30th, 2020-Tecentriq plus Avastin  #Cirrhosis-child Pugh C decompensated-Lasix potassium  # hep C [untreated]/alcohol  # Early 1990s- Hep C [needle sharing >35 years in [place]; no treatment.     DIAGNOSIS: Jacob Wilcox  STAGE:    IV     ;GOALS: palliative  CURRENT/MOST RECENT THERAPY : Tecentriq plus Avastin    Cancer, hepatocellular ([place])  01/13/2019 Initial Diagnosis   Cancer, hepatocellular ([place])   01/22/2019 -  Chemotherapy   The patient had bevacizumab (AVASTIN) 1,000 mg in sodium chloride 0.9 % 100 mL chemo infusion, 1,075 mg, Intravenous,  Once, 1 of 6 cycles Administration: 1,000 mg (01/22/2019) atezolizumab (TECENTRIQ) 1,200 mg in sodium chloride 0.9 % 250 mL chemo infusion, 1,200 mg, Intravenous, Once, 1 of 6 cycles Administration: 1,200 mg (01/22/2019)  for chemotherapy treatment.      ALLERGIES:  is allergic to fentanyl and poison ivy extract.   MEDICATIONS:  Current Outpatient Medications  Medication Sig Dispense Refill   furosemide (LASIX) 40 MG tablet Take 1 tablet (40 mg total) by mouth daily. 30 tablet 2   oxyCODONE (OXY IR/ROXICODONE) 5 MG immediate release tablet Take 1 tablet (5 mg total) by mouth every 6 (six) hours as needed for severe pain. 45 tablet 0   potassium chloride SA (K-DUR) 20 MEQ tablet 1 pill twice a day 30 tablet 3   prochlorperazine (COMPAZINE) 10 MG tablet Take 1 tablet (10 mg total)  by mouth every 6 (six) hours as needed for nausea or vomiting. 40 tablet 1   fentaNYL (DURAGESIC) 12 MCG/HR Place 1 patch onto the skin every 3 (three) days for 3 days. 5 patch 0   Loperamide HCl (IMODIUM A-D PO) Take by mouth daily as needed.     naloxone (NARCAN) nasal spray 4 mg/0.1 mL 4 mg (contents of 1 nasal spray) as a single dose in one nostril; may repeat every 2 to 3 minutes in alternating  nostrils until medical assistance becomes available- for opiate overdose 1 each 0   vitamin B-12 (CYANOCOBALAMIN) 1000 MCG tablet Take 1,000 mcg by mouth daily.     No current facility-administered medications for this visit.     VITAL SIGNS: BP 129/67 (BP Location: Right Arm, Patient Position: Sitting)    Pulse (!) 103    Temp 97.8 F (36.6 C) (Tympanic)    Resp 18    Wt 160 lb (72.6 kg)    SpO2 98%    BMI 22.96 kg/m  Filed Weights   01/29/19 1123  Weight: 160 lb (72.6 kg)    Estimated body mass index is 22.96 kg/m as calculated from the following:   Height as of 01/22/19: '5\' 10"'  (1.778 m).   Weight as of this encounter: 160 lb (72.6 kg).   LABS: CBC:    Component Value Date/Time   WBC 3.2 (L) 01/29/2019 1057   HGB 11.8 (L) 01/29/2019 1057   HCT 34.3 (L) 01/29/2019 1057   PLT 63 (L) 01/29/2019 1057   MCV 98.6 01/29/2019 1057   NEUTROABS 2.2 01/29/2019 1057   LYMPHSABS 0.6 (L) 01/29/2019 1057   MONOABS 0.3 01/29/2019 1057   EOSABS 0.1 01/29/2019 1057   BASOSABS 0.0 01/29/2019 1057   Comprehensive Metabolic Panel:    Component Value Date/Time   NA 129 (L) 01/29/2019 1057   K 3.6 01/29/2019 1057   CL 90 (L) 01/29/2019 1057   CO2 30 01/29/2019 1057   BUN 10 01/29/2019 1057   CREATININE 0.71 01/29/2019 1057   GLUCOSE 134 (H) 01/29/2019 1057   CALCIUM 8.9 01/29/2019 1057   AST 164 (H) 01/29/2019 1057   ALT 48 (H) 01/29/2019 1057   ALKPHOS 181 (H) 01/29/2019 1057   BILITOT 1.9 (H) 01/29/2019 1057   PROT 7.1 01/29/2019 1057   ALBUMIN 2.7 (L) 01/29/2019 1057    RADIOGRAPHIC STUDIES: Ct Abdomen Pelvis W Contrast  Result Date: 01/12/2019 CLINICAL DATA:  Abdominal distension, lower abdominal pain EXAM: CT ABDOMEN AND PELVIS WITH CONTRAST TECHNIQUE: Multidetector CT imaging of the abdomen and pelvis was performed using the standard protocol following bolus administration of intravenous contrast. CONTRAST:  167m OMNIPAQUE IOHEXOL 300 MG/ML  SOLN COMPARISON:  None.  FINDINGS: Lower chest: Numerous bilateral pulmonary nodules seen in the lower lungs. Index right lower lobe nodule on image 1 measures 9 mm. Index left lower lobe nodule on image 4 measures 7 mm. No effusions. Heart is normal size. Hepatobiliary: Nodular contours within the liver compatible with cirrhosis. There are numerous ill-defined low-density masses in the liver concerning for malignancy. Index lesion in the hepatic dome measures approximately 5.4 cm. Ill-defined low-density lesions throughout the right hepatic lobe predominantly. Filling defect in the main portal vein and left portal vein compatible with partially occlusive thrombus. Right portal vein is not visualized, likely occluded. Pancreas: No focal abnormality or ductal dilatation. Spleen: No focal abnormality.  Normal size. Adrenals/Urinary Tract: No adrenal abnormality. No focal renal abnormality. No stones or hydronephrosis. Urinary bladder is  unremarkable. Stomach/Bowel: Stomach, large and small bowel grossly unremarkable. Vascular/Lymphatic: Aortic atherosclerosis. No enlarged abdominal or pelvic lymph nodes. Reproductive: No visible focal abnormality. Other: Moderate free fluid in the pelvis and around the liver. No free air. Musculoskeletal: No acute bony abnormality. IMPRESSION: Nodular contours of the liver with enlargement of the left hepatic lobe compatible with cirrhosis. Extensive ill-defined low-density areas predominantly throughout the right lobe of the liver. It is difficult to determine if this is primary hepatocellular carcinoma or metastases. 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 Right portal vein is not visualized and may be occluded. Numerous bilateral lower lobe lung pulmonary nodules compatible with metastases. Moderate ascites around the liver and in the pelvis. Aortic atherosclerosis. Electronically Signed   By: Rolm Baptise M.D.   On: 01/12/2019 10:45   Mr Liver W Wo Contrast  Result  Date: 01/15/2019 CLINICAL DATA:  63 year old male with liver lesion noted on prior CT examination. History of hepatitis C and cirrhosis. Abdominal pain. Follow-up study. EXAM: MRI ABDOMEN WITHOUT AND WITH CONTRAST TECHNIQUE: Multiplanar multisequence MR imaging of the abdomen was performed both before and after the administration of intravenous contrast. CONTRAST:  7 mL of Gadavist. COMPARISON:  No prior abdominal MRI.  CT the abdomen and pelvis FINDINGS: Lower chest: Multiple small pulmonary nodules are noted in the visualize lung bases measuring up to 8 mm in the right lower lobe. Hepatobiliary: Liver has a nodular contour, indicative of underlying cirrhosis. There are multiple heterogeneous appearing hepatic lesions scattered throughout the liver, most evident in the right lobe of the liver. The largest of these is centered in segment 8 near the dome (axial image 18 of series 15) measuring 6.6 x 6.1 cm. This lesion is heterogeneous in signal intensity on T1 and T2 weighted images, but generally mildly T1 hyperintense and T2 hypointense with mild heterogeneous arterial phase hyperenhancement and some minimal delayed washout. Several other lesions are noted, including a small lesion in between segments 6 and 7 (axial image 34 of series 15) measuring 3.4 x 2.9 cm which demonstrates isointense to mildly hyperintense T1 signal intensity, T2 hypointensity, and demonstrates arterial phase hyperenhancement with definitive delayed washout. Innumerable other smaller lesions are also noted. The portal vein appears expanded, likely by tumor thrombus. This extends into both the left and right branches of the portal vein. The left branch remains partially patent, while the right branches completely occluded. No intra or extrahepatic biliary ductal dilatation. Gallbladder is unremarkable in appearance. Pancreas: No pancreatic mass. No pancreatic ductal dilatation. Small amount of peripancreatic fluid, without a well-defined  organized fluid collection. Spleen:  Unremarkable. Adrenals/Urinary Tract: Bilateral kidneys and adrenal glands are normal in appearance. No hydroureteronephrosis in the visualized portions of the abdomen. Stomach/Bowel: Visualized portions are unremarkable. Vascular/Lymphatic: Aortic atherosclerosis, without definite aneurysm in the abdominal vasculature. Portal vein thrombus, as detailed above. 9 mm short axis gastrohepatic ligament lymph node (axial image 32 of series 15) which demonstrates some mild diffusion restriction, suspicious for metastatic lymph node. Other:  Moderate volume of ascites. Musculoskeletal: No aggressive appearing osseous lesions are noted in the visualized portions of the skeleton. IMPRESSION: 1. Cirrhosis with multifocal infiltrative neoplasm most evident in the right lobe of the liver, as detailed above, most likely to reflect multifocal hepatocellular carcinoma. This is associated with probable tumor thrombus in the portal venous system, as detailed above. Widespread metastatic disease noted in the lung bases. Borderline enlarged gastrohepatic ligament lymph node demonstrating diffusion restriction also suspicious for nodal  metastasis. 2. Moderate volume of ascites. 3. Additional incidental findings, as above. Electronically Signed   By: Vinnie Langton M.D.   On: 01/15/2019 12:22    PERFORMANCE STATUS (ECOG) : 2 - Symptomatic, <50% confined to bed   Review of Systems  Constitutional: Positive for activity change, appetite change and fatigue. Negative for unexpected weight change.  HENT: Negative for mouth sores and trouble swallowing.   Eyes: Negative for pain and visual disturbance.  Respiratory: Negative for cough and shortness of breath.   Cardiovascular: Positive for leg swelling. Negative for chest pain.  Gastrointestinal: Positive for abdominal distention, abdominal pain and diarrhea. Negative for constipation, nausea and vomiting.  Endocrine: Negative for cold  intolerance and polyuria.  Genitourinary: Positive for decreased urine volume. Negative for difficulty urinating.  Musculoskeletal: Negative for arthralgias, gait problem and myalgias.  Skin: Negative for rash and wound.  Neurological: Negative for dizziness, weakness and light-headedness.  Psychiatric/Behavioral: Positive for sleep disturbance. The patient is nervous/anxious.     Physical Exam Constitutional:      Comments: Poor hygiene, disheveled, alone; seen in exam room  HENT:     Mouth/Throat:     Mouth: Mucous membranes are dry.     Pharynx: Oropharynx is clear.  Eyes:     General: No scleral icterus.    Conjunctiva/sclera: Conjunctivae normal.  Cardiovascular:     Rate and Rhythm: Normal rate and regular rhythm.  Pulmonary:     Effort: No respiratory distress.     Breath sounds: No wheezing.     Comments: Decreased air entry Abdominal:     General: There is distension.     Tenderness: There is abdominal tenderness.     Comments: Intermittent increased episodes of spasm like pain; flatulence observed.  Hepatomegaly at the umbilicus.   Musculoskeletal:     Right lower leg: Edema present.     Left lower leg: Edema present.  Skin:    General: Skin is warm and dry.     Findings: Lesion present.     Comments: Multiple scabs on arms and chest without signs of acute infection.  Neurological:     Mental Status: Jacob Wilcox is alert and oriented to person, place, and time.  Psychiatric:        Mood and Affect: Mood is anxious.        Speech: Speech normal.        Thought Content: Thought content normal.        Judgment: Judgment is impulsive.    IMPRESSION: I met with patient today in clinic. I introduced palliative care and attempted to establish therapeutic rapport. Prior to visit, I discussed case with Dr. Rogue Bussing who advises treatment plan including tecentriq and avastin has response rate of approximately 30% and treatment given with palliative intent and overall poor  prognosis.  Due to his comorbidities, treatment options were limited and we discussed options for treatment that focus on comfort and quality of life.  Jacob Wilcox is understanding of poor prognosis but says Jacob Wilcox would like to continue treatment.  Jacob Wilcox expresses frustration over current living situation, residing in mother-in-law's house with reported 'hoarding'.  Jacob Wilcox says his family is still in [place] and Jacob Wilcox's felt socially isolated since moving to [place].  Jacob Wilcox is having a hard time coping with his diagnosis and reports fighting and yelling at home between Jacob Wilcox and his wife.  Jacob Wilcox says that his wife is currently in school online and that she is not able to provide Jacob Wilcox care around  the home or transport Jacob Wilcox to appointments given interruptions in her schoolwork.  Jacob Wilcox says that it is important for her to complete school because she will need to be able to support herself once Jacob Wilcox is gone.  We discussed options for help at home given his increased weakness including home health which Jacob Wilcox declined due to concerns of clutter in the home.  Jacob Wilcox says Jacob Wilcox is coping "okay" with his cancer diagnosis and says "it is what it is".  Jacob Wilcox endorses feelings of anger, anxiety, and sadness.  Denies suicidal ideation.   Jacob Wilcox continues to have abdominal pain and spasms which are poorly controlled by oxycodone.  Jacob Wilcox sometimes takes medication more frequently than prescribed.  Fentanyl 50 mcg patch was previously prescribed but discontinued due to altered mental status/lethargy.  We discussed this today and patient discloses that Jacob Wilcox had been drinking alcohol and taking additional oxycodone at that time.    Jacob Wilcox says that the fullness in his abdomen and spasm-like pain is most bothersome and Jacob Wilcox feels that his abdomen is full. Jacob Wilcox requests paracentesis. Appetite is poor. Weight has been stable but Jacob Wilcox can tell Jacob Wilcox is losing muscle and is weak.  Jacob Wilcox does not use any DME.  PLAN: - Continue current scope of treatment per Dr. Rogue Bussing - refer to  interventional radiology for palliative paracentesis; suspect ascites will reaccumulate; consider tenkoff catheter as I suspect rapid reaccumulation & complicating pain - IV fluids in clinic today - Refer to GI for esophageal varices in setting of alcoholic cirrhosis and endoscopy - Referral to home palliative care for advance care planning, goals of care, and ongoing symptom management  - Start fentanyl patch 12.5 mcg per 72 hours for long-acting pain; generally in setting of advanced liver disease I would recommend maximizing use of fentanyl vs oxycodone and anticipate uptitration of fentanyl patch to assess tolerance.  - Narcan nasal spray for opioid overdose-directions for use discussed and patient advised to teach wife on how to use. - Continue oxycodone 5 mg every 6 hours as needed for breakthrough pain - UDS at next visit  - refer to Rosalyn Gess, OT for evaluation in clinic - complex patient with tenuous status. I discussed findings with Dr. Tish Men and Billey Chang, palliative care NP who both contributed to plan of care.  - follow up with Josh Borders on 02/03/2019  Patient expressed understanding and was in agreement with this plan. Jacob Wilcox also understands that Jacob Wilcox can call the clinic at any time with any questions, concerns, or complaints.   Time Total: 50 minutes  Visit consisted of counseling and education dealing with the complex and emotionally intense issues of symptom management and palliative care in the setting of serious and potentially life-threatening illness.Greater than 50%  of this time was spent counseling and coordinating care related to the above assessment and plan.  Signed by: Beckey Rutter, DNP, AGNP-C [facility] (work cell) [phone] (office)  CC: Billey Chang, NP & Dr. Rogue Bussing

## 2019-01-29 NOTE — Telephone Encounter (Signed)
Called patient to inform him of his paracentisis that scheduled for tomorrow at 10:00 at the [place] [facility] and his follow up on Tuesday with Josh + labs (cbc, cmp).  Patient notified and verbalized understanding.   dhs

## 2019-01-29 NOTE — Progress Notes (Signed)
Patient here for follow up in the palliative care clinic. He reports continued abdominal pain which is not relieved by the oxycodone every 6 hours. He is having to take the medicine more frequently, and the pain wakes him up at night. He reports not sleeping well and feeling exhausted.

## 2019-01-30 ENCOUNTER — Ambulatory Visit
Admission: RE | Admit: 2019-01-30 | Discharge: 2019-01-30 | Disposition: A | Discharge status: Home / Self Care | Payer: Medicaid Other | Source: Ambulatory Visit | Attending: Nurse Practitioner | Admitting: Nurse Practitioner

## 2019-01-30 DIAGNOSIS — R188 Other ascites: Secondary | ICD-10-CM

## 2019-01-30 DIAGNOSIS — R180 Malignant ascites: Secondary | ICD-10-CM | POA: Diagnosis present

## 2019-01-30 DIAGNOSIS — C220 Liver cell carcinoma: Secondary | ICD-10-CM | POA: Insufficient documentation

## 2019-01-30 LAB — BODY FLUID CELL COUNT WITH DIFFERENTIAL
Eos, Fluid: 0 %
Lymphs, Fluid: 59 %
Monocyte-Macrophage-Serous Fluid: 35 %
Neutrophil Count, Fluid: 6 %
Other Cells, Fluid: 0 %
Total Nucleated Cell Count, Fluid: 279 cu mm

## 2019-01-30 LAB — PROTEIN, PLEURAL OR PERITONEAL FLUID: Total protein, fluid: <3 g/dL

## 2019-01-30 NOTE — Procedures (Signed)
Pre Procedural Dx: Symptomatic Ascites Post Procedural Dx: Same  Successful US guided paracentesis yielding 1.6 L of serous ascitic fluid. Sample sent to laboratory as requested.  EBL: None  Complications: None immediate  Ronny Bacon, MD Pager #: [phone]

## 2019-01-31 LAB — PROTEIN, BODY FLUID (OTHER): Total Protein, Body Fluid Other: 1.4 g/dL

## 2019-02-02 ENCOUNTER — Other Ambulatory Visit: Payer: Self-pay

## 2019-02-02 LAB — BODY FLUID CULTURE: Culture: NO GROWTH

## 2019-02-02 LAB — CYTOLOGY - NON PAP

## 2019-02-03 ENCOUNTER — Inpatient Hospital Stay (HOSPITAL_BASED_OUTPATIENT_CLINIC_OR_DEPARTMENT_OTHER): Payer: Medicaid Other | Admitting: Hospice and Palliative Medicine

## 2019-02-03 ENCOUNTER — Telehealth: Payer: Self-pay | Admitting: Nurse Practitioner

## 2019-02-03 ENCOUNTER — Inpatient Hospital Stay: Payer: Medicaid Other

## 2019-02-03 ENCOUNTER — Other Ambulatory Visit: Payer: Self-pay

## 2019-02-03 VITALS — BP 104/63 | HR 89 | Temp 98.7°F | Resp 18

## 2019-02-03 VITALS — BP 112/74 | HR 78 | Resp 18

## 2019-02-03 DIAGNOSIS — I81 Portal vein thrombosis: Secondary | ICD-10-CM

## 2019-02-03 DIAGNOSIS — C220 Liver cell carcinoma: Secondary | ICD-10-CM

## 2019-02-03 DIAGNOSIS — R180 Malignant ascites: Secondary | ICD-10-CM

## 2019-02-03 DIAGNOSIS — Z66 Do not resuscitate: Secondary | ICD-10-CM

## 2019-02-03 DIAGNOSIS — R531 Weakness: Secondary | ICD-10-CM

## 2019-02-03 DIAGNOSIS — Z515 Encounter for palliative care: Secondary | ICD-10-CM

## 2019-02-03 DIAGNOSIS — R188 Other ascites: Secondary | ICD-10-CM

## 2019-02-03 DIAGNOSIS — Z79899 Other long term (current) drug therapy: Secondary | ICD-10-CM

## 2019-02-03 DIAGNOSIS — I951 Orthostatic hypotension: Secondary | ICD-10-CM

## 2019-02-03 DIAGNOSIS — B1920 Unspecified viral hepatitis C without hepatic coma: Secondary | ICD-10-CM

## 2019-02-03 DIAGNOSIS — R918 Other nonspecific abnormal finding of lung field: Secondary | ICD-10-CM

## 2019-02-03 DIAGNOSIS — K7460 Unspecified cirrhosis of liver: Secondary | ICD-10-CM

## 2019-02-03 DIAGNOSIS — Z9221 Personal history of antineoplastic chemotherapy: Secondary | ICD-10-CM

## 2019-02-03 DIAGNOSIS — E860 Dehydration: Secondary | ICD-10-CM

## 2019-02-03 DIAGNOSIS — R600 Localized edema: Secondary | ICD-10-CM

## 2019-02-03 LAB — CBC WITH DIFFERENTIAL/PLATELET
Abs Immature Granulocytes: 0.02 10*3/uL (ref 0.00–0.07)
Basophils Absolute: 0 10*3/uL (ref 0.0–0.1)
Basophils Relative: 0 %
Eosinophils Absolute: 0.1 10*3/uL (ref 0.0–0.5)
Eosinophils Relative: 3 %
HCT: 34.8 % — ABNORMAL LOW (ref 39.0–52.0)
Hemoglobin: 12.1 g/dL — ABNORMAL LOW (ref 13.0–17.0)
Immature Granulocytes: 1 %
Lymphocytes Relative: 19 %
Lymphs Abs: 0.6 10*3/uL — ABNORMAL LOW (ref 0.7–4.0)
MCH: 33.7 pg (ref 26.0–34.0)
MCHC: 34.8 g/dL (ref 30.0–36.0)
MCV: 96.9 fL (ref 80.0–100.0)
Monocytes Absolute: 0.3 10*3/uL (ref 0.1–1.0)
Monocytes Relative: 9 %
Neutro Abs: 2 10*3/uL (ref 1.7–7.7)
Neutrophils Relative %: 68 %
Platelets: 53 10*3/uL — ABNORMAL LOW (ref 150–400)
RBC: 3.59 MIL/uL — ABNORMAL LOW (ref 4.22–5.81)
RDW: 15.8 % — ABNORMAL HIGH (ref 11.5–15.5)
WBC: 3 10*3/uL — ABNORMAL LOW (ref 4.0–10.5)
nRBC: 0 % (ref 0.0–0.2)

## 2019-02-03 LAB — COMPREHENSIVE METABOLIC PANEL
ALT: 53 U/L — ABNORMAL HIGH (ref 0–44)
AST: 193 U/L — ABNORMAL HIGH (ref 15–41)
Albumin: 2.5 g/dL — ABNORMAL LOW (ref 3.5–5.0)
Alkaline Phosphatase: 180 U/L — ABNORMAL HIGH (ref 38–126)
Anion gap: 9 (ref 5–15)
BUN: 17 mg/dL (ref 8–23)
CO2: 25 mmol/L (ref 22–32)
Calcium: 8.7 mg/dL — ABNORMAL LOW (ref 8.9–10.3)
Chloride: 96 mmol/L — ABNORMAL LOW (ref 98–111)
Creatinine, Ser: 0.67 mg/dL (ref 0.61–1.24)
GFR calc Af Amer: >60 mL/min (ref >60)
GFR calc non Af Amer: >60 mL/min (ref >60)
Glucose, Bld: 116 mg/dL — ABNORMAL HIGH (ref 70–99)
Potassium: 3.6 mmol/L (ref 3.5–5.1)
Sodium: 130 mmol/L — ABNORMAL LOW (ref 135–145)
Total Bilirubin: 1.8 mg/dL — ABNORMAL HIGH (ref 0.3–1.2)
Total Protein: 6.8 g/dL (ref 6.5–8.1)

## 2019-02-03 MED ORDER — SODIUM CHLORIDE 0.9 % IV SOLN
Freq: Once | INTRAVENOUS | Status: AC
  Administered 2019-02-03: 12:00:00 via INTRAVENOUS
  Filled 2019-02-03: qty 250

## 2019-02-03 MED ORDER — ESCITALOPRAM OXALATE 10 MG PO TABS: 5.0000 mg | ORAL_TABLET | Freq: Every day | ORAL | 0 refills | Status: AC

## 2019-02-03 MED ORDER — FENTANYL 25 MCG/HR TD PT72: 1.0000 | MEDICATED_PATCH | TRANSDERMAL | 0 refills | Status: DC

## 2019-02-03 MED ORDER — SULFAMETHOXAZOLE-TRIMETHOPRIM 400-80 MG PO TABS: 1.0000 | ORAL_TABLET | Freq: Two times a day (BID) | ORAL | 0 refills | Status: AC

## 2019-02-03 MED ORDER — OXYCODONE HCL 5 MG PO TABS: 5.0000 mg | ORAL_TABLET | Freq: Four times a day (QID) | ORAL | 0 refills | Status: DC | PRN

## 2019-02-03 NOTE — Telephone Encounter (Signed)
Rec'd call back from wife stating that patient was at the Cancer center and receiving IV fluids earlier.  She requested that I call patient to schedule the appointment.  I told her that I would contact later this afternoon.

## 2019-02-03 NOTE — Progress Notes (Signed)
[place]  [phone] [phone]   Name: Jacob Wilcox Date: 02/03/2019 MRN: [id number]  DOB: Nov 15, 1955  Patient Care Team: Patient, No Pcp Per as PCP - General (General Practice) Clent Jacks, RN as Oncology Nurse Navigator    REASON FOR CONSULTATION: Palliative Care consult requested for this 63 y.o. 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  He presented to ER in July 2020 for complaints of lower abdominal pain and distention.  CT was performed with findings concerning for metastatic hepatocellular carcinoma. Alpha-fetoprotein 3 was 40,000.  This was followed by MRI which also suggestive of hepatocellular carcinoma.  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  He initiated palliative Tecentriq and Avastin on 01/22/2019. Palliative care was consulted to discuss goals of care and ongoing symptom management.   SOCIAL HISTORY:     reports that he has never smoked. He has never used smokeless tobacco. He reports current alcohol use. He reports that he does not use drugs.   Moved from [place] with his wife approximately 1 year ago.  Has 2 children still living in [place].  Is currently living in his mother-in-law's home while she is in nursing facility.    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  ADVANCE DIRECTIVES:  Does not have  CODE STATUS: DNR (MOST form completed on 02/03/19)  PAST MEDICAL HISTORY: Past Medical History:  Diagnosis Date   Cancer ([place])    Stage 4 Liver   Hepatitis C     PAST SURGICAL HISTORY:  Past Surgical History:  Procedure Laterality Date   HERNIA REPAIR     not sure what it was--when he was a baby    HEMATOLOGY/ONCOLOGY HISTORY:  Oncology History  Overview Note  # July 2020-multiple liver lesions; lung lesions-stage IV/[place] stage C AFP-340,000; July 23 MRI liver-multifocal; 6 x 6 dome of liver; # ~3 x 3 cm suggestive of HCC; no biopsy;   #July30th, 2020-Tecentriq plus Avastin  #Cirrhosis-child Pugh C decompensated-Lasix potassium  # hep C [untreated]/alcohol  # Early 1990s- Hep C [needle sharing >35 years in [place]; no treatment.     DIAGNOSIS: [place]  STAGE:    IV     ;GOALS: palliative  CURRENT/MOST RECENT THERAPY : Tecentriq plus Avastin    Cancer, hepatocellular ([place])  01/13/2019 Initial Diagnosis   Cancer, hepatocellular ([place])   01/22/2019 -  Chemotherapy   The patient had bevacizumab (AVASTIN) 1,000 mg in sodium chloride 0.9 % 100 mL chemo infusion, 1,075 mg, Intravenous,  Once, 1 of 6 cycles Administration: 1,000 mg (01/22/2019) atezolizumab (TECENTRIQ) 1,200 mg in sodium chloride 0.9 % 250 mL chemo infusion, 1,200 mg, Intravenous, Once, 1 of 6 cycles Administration: 1,200 mg (01/22/2019)  for chemotherapy treatment.      ALLERGIES:  is allergic to fentanyl and poison ivy extract.  MEDICATIONS:  Current Outpatient Medications  Medication Sig Dispense Refill   fentaNYL (DURAGESIC) 12 MCG/HR Place 1 patch onto the skin every 3 (three) days for 15 days. 5 patch 0   furosemide (LASIX) 40 MG tablet Take 1 tablet (40 mg total) by mouth daily. 30 tablet 2   Loperamide HCl (IMODIUM A-D PO) Take by mouth daily as needed.     naloxone (NARCAN) nasal spray 4 mg/0.1 mL See instrucitons 1 each 0  oxyCODONE (OXY IR/ROXICODONE) 5 MG immediate release tablet Take 1 tablet (5 mg total) by mouth every 6 (six) hours as needed for severe pain. 45 tablet 0   potassium chloride SA (K-DUR) 20 MEQ tablet 1 pill twice a day 30 tablet 3   prochlorperazine (COMPAZINE) 10 MG tablet Take 1 tablet (10 mg total) by mouth every 6 (six) hours as needed for nausea or vomiting. 40 tablet 1   vitamin B-12 (CYANOCOBALAMIN) 1000 MCG  tablet Take 1,000 mcg by mouth daily.     No current facility-administered medications for this visit.    Facility-Administered Medications Ordered in Other Visits  Medication Dose Route Frequency Provider Last Rate Last Dose   0.9 %  sodium chloride infusion   Intravenous Once Verlon Au, NP        VITAL SIGNS: BP 104/63    Pulse 89    Temp 98.7 F (37.1 C) (Tympanic)    Resp 18  There were no vitals filed for this visit.  Estimated body mass index is 22.96 kg/m as calculated from the following:   Height as of 01/22/19: '5\' 10"'$  (1.778 m).   Weight as of 01/29/19: 160 lb (72.6 kg).  LABS: CBC:    Component Value Date/Time   WBC 3.0 (L) 02/03/2019 1023   HGB 12.1 (L) 02/03/2019 1023   HCT 34.8 (L) 02/03/2019 1023   PLT 53 (L) 02/03/2019 1023   MCV 96.9 02/03/2019 1023   NEUTROABS 2.0 02/03/2019 1023   LYMPHSABS 0.6 (L) 02/03/2019 1023   MONOABS 0.3 02/03/2019 1023   EOSABS 0.1 02/03/2019 1023   BASOSABS 0.0 02/03/2019 1023   Comprehensive Metabolic Panel:    Component Value Date/Time   NA 130 (L) 02/03/2019 1023   K 3.6 02/03/2019 1023   CL 96 (L) 02/03/2019 1023   CO2 25 02/03/2019 1023   BUN 17 02/03/2019 1023   CREATININE 0.67 02/03/2019 1023   GLUCOSE 116 (H) 02/03/2019 1023   CALCIUM 8.7 (L) 02/03/2019 1023   AST 193 (H) 02/03/2019 1023   ALT 53 (H) 02/03/2019 1023   ALKPHOS 180 (H) 02/03/2019 1023   BILITOT 1.8 (H) 02/03/2019 1023   PROT 6.8 02/03/2019 1023   ALBUMIN 2.5 (L) 02/03/2019 1023    RADIOGRAPHIC STUDIES: Ct Abdomen Pelvis W Contrast  Result Date: 01/12/2019 CLINICAL DATA:  Abdominal distension, lower abdominal pain EXAM: CT ABDOMEN AND PELVIS WITH CONTRAST TECHNIQUE: Multidetector CT imaging of the abdomen and pelvis was performed using the standard protocol following bolus administration of intravenous contrast. CONTRAST:  134m OMNIPAQUE IOHEXOL 300 MG/ML  SOLN COMPARISON:  None. FINDINGS: Lower chest: Numerous bilateral pulmonary nodules  seen in the lower lungs. Index right lower lobe nodule on image 1 measures 9 mm. Index left lower lobe nodule on image 4 measures 7 mm. No effusions. Heart is normal size. Hepatobiliary: Nodular contours within the liver compatible with cirrhosis. There are numerous ill-defined low-density masses in the liver concerning for malignancy. Index lesion in the hepatic dome measures approximately 5.4 cm. Ill-defined low-density lesions throughout the right hepatic lobe predominantly. Filling defect in the main portal vein and left portal vein compatible with partially occlusive thrombus. Right portal vein is not visualized, likely occluded. Pancreas: No focal abnormality or ductal dilatation. Spleen: No focal abnormality.  Normal size. Adrenals/Urinary Tract: No adrenal abnormality. No focal renal abnormality. No stones or hydronephrosis. Urinary bladder is unremarkable. Stomach/Bowel: Stomach, large and small bowel grossly unremarkable. Vascular/Lymphatic: Aortic atherosclerosis. No enlarged abdominal or pelvic lymph nodes.  Reproductive: No visible focal abnormality. Other: Moderate free fluid in the pelvis and around the liver. No free air. Musculoskeletal: No acute bony abnormality. IMPRESSION: Nodular contours of the liver with enlargement of the left hepatic lobe compatible with cirrhosis. Extensive ill-defined low-density areas predominantly throughout the right lobe of the liver. It is difficult to determine if this is primary hepatocellular carcinoma or metastases. 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 Right portal vein is not visualized and may be occluded. Numerous bilateral lower lobe lung pulmonary nodules compatible with metastases. Moderate ascites around the liver and in the pelvis. Aortic atherosclerosis. Electronically Signed   By: Rolm Baptise M.D.   On: 01/12/2019 10:45   Mr Liver W Wo Contrast  Result Date: 01/15/2019 CLINICAL DATA:  63 year old male with liver  lesion noted on prior CT examination. History of hepatitis C and cirrhosis. Abdominal pain. Follow-up study. EXAM: MRI ABDOMEN WITHOUT AND WITH CONTRAST TECHNIQUE: Multiplanar multisequence MR imaging of the abdomen was performed both before and after the administration of intravenous contrast. CONTRAST:  7 mL of Gadavist. COMPARISON:  No prior abdominal MRI.  CT the abdomen and pelvis FINDINGS: Lower chest: Multiple small pulmonary nodules are noted in the visualize lung bases measuring up to 8 mm in the right lower lobe. Hepatobiliary: Liver has a nodular contour, indicative of underlying cirrhosis. There are multiple heterogeneous appearing hepatic lesions scattered throughout the liver, most evident in the right lobe of the liver. The largest of these is centered in segment 8 near the dome (axial image 18 of series 15) measuring 6.6 x 6.1 cm. This lesion is heterogeneous in signal intensity on T1 and T2 weighted images, but generally mildly T1 hyperintense and T2 hypointense with mild heterogeneous arterial phase hyperenhancement and some minimal delayed washout. Several other lesions are noted, including a small lesion in between segments 6 and 7 (axial image 34 of series 15) measuring 3.4 x 2.9 cm which demonstrates isointense to mildly hyperintense T1 signal intensity, T2 hypointensity, and demonstrates arterial phase hyperenhancement with definitive delayed washout. Innumerable other smaller lesions are also noted. The portal vein appears expanded, likely by tumor thrombus. This extends into both the left and right branches of the portal vein. The left branch remains partially patent, while the right branches completely occluded. No intra or extrahepatic biliary ductal dilatation. Gallbladder is unremarkable in appearance. Pancreas: No pancreatic mass. No pancreatic ductal dilatation. Small amount of peripancreatic fluid, without a well-defined organized fluid collection. Spleen:  Unremarkable.  Adrenals/Urinary Tract: Bilateral kidneys and adrenal glands are normal in appearance. No hydroureteronephrosis in the visualized portions of the abdomen. Stomach/Bowel: Visualized portions are unremarkable. Vascular/Lymphatic: Aortic atherosclerosis, without definite aneurysm in the abdominal vasculature. Portal vein thrombus, as detailed above. 9 mm short axis gastrohepatic ligament lymph node (axial image 32 of series 15) which demonstrates some mild diffusion restriction, suspicious for metastatic lymph node. Other:  Moderate volume of ascites. Musculoskeletal: No aggressive appearing osseous lesions are noted in the visualized portions of the skeleton. IMPRESSION: 1. Cirrhosis with multifocal infiltrative neoplasm most evident in the right lobe of the liver, as detailed above, most likely to reflect multifocal hepatocellular carcinoma. This is associated with probable tumor thrombus in the portal venous system, as detailed above. Widespread metastatic disease noted in the lung bases. Borderline enlarged gastrohepatic ligament lymph node demonstrating diffusion restriction also suspicious for nodal metastasis. 2. Moderate volume of ascites. 3. Additional incidental findings, as above. Electronically Signed   By: Quillian Quince  Entrikin M.D.   On: 01/15/2019 12:22   US Paracentesis  Result Date: 01/30/2019 INDICATION: Recent diagnosis of cirrhosis and multifocal hepatocellular carcinoma, now with symptomatic intra-abdominal ascites. Please perform ultrasound-guided paracentesis for diagnostic and therapeutic purposes. EXAM: ULTRASOUND-GUIDED PARACENTESIS COMPARISON:  CT abdomen pelvis-01/12/2019; abdominal MRI-01/15/2019 MEDICATIONS: None. COMPLICATIONS: None immediate. TECHNIQUE: Informed written consent was obtained from the patient after a discussion of the risks, benefits and alternatives to treatment. A timeout was performed prior to the initiation of the procedure. Initial ultrasound scanning demonstrates a  small to moderate amount of ascites within the right lower abdominal quadrant. The right lower abdomen was prepped and draped in the usual sterile fashion. 1% lidocaine with epinephrine was used for local anesthesia. Under direct ultrasound guidance, a 19 gauge, 7-cm, Yueh catheter was introduced. An ultrasound image was saved for documentation purposed. The paracentesis was performed. The catheter was removed and a dressing was applied. The patient tolerated the procedure well without immediate post procedural complication. FINDINGS: A total of approximately 1.6 liters of serous fluid was removed. Samples were sent to the laboratory as requested by the clinical team. IMPRESSION: Successful ultrasound-guided paracentesis yielding 1.6 liters of peritoneal fluid. Electronically Signed   By: Sandi Mariscal M.D.   On: 01/30/2019 11:50    PERFORMANCE STATUS (ECOG) : 2 - Symptomatic, <50% confined to bed  Review of Systems Unless otherwise noted, a complete review of systems is negative.  Physical Exam General: Frail-appearing Cardiovascular: regular rate and rhythm Pulmonary: clear ant fields Abdomen: Distended, tympanic, no tenderness to palpation GU: no suprapubic tenderness Extremities: Bilateral lower extremity edema Skin: Crusted lesion to left cheek Neurological: Weakness but otherwise nonfocal  IMPRESSION: Met with patient today for routine follow-up from his visit with Ander Purpura, NP.  Patient says that he has had worse pain with dose reduction of the fentanyl.  He says his pain was actually reasonably well-controlled on the 50 mcg of fentanyl but the dose was reduced when he became lethargic.  However, patient admits to having used his oxycodone liberally at that same time and was often taking 2 oxycodone tablets every few hours.  I generally would recommend maximizing use of fentanyl over oxycodone given his advanced liver disease.  We will dose increased today to 25 mcg.  We will plan to write a  new prescription and patient was instructed to keep his 12 mcg patches so that we could consider incremental dose increase to 37 mcg if needed.  Will refill oxycodone for breakthrough pain.  Patient says he is generally only using it once or twice a day.  Patient is status post paracentesis last week with 1.6 L of ascites removed.  He has had progressive abdominal distention following the procedure and I suspect rapid reaccumulation.  We will schedule him for repeat paracentesis.  Patient was dehydrated and orthostatic today.  We will plan to give him albumin IV at time of paracentesis.  Patient given 1 L of IV fluids today and felt markedly better afterwards.  Vitals improved following IV administration.  Patient has a crusted lesion to left cheek and describes having purulent drainage over the past week.  He has applied bacitracin with little effect.  We will plan to start Bactrim twice daily, which would also provide some coverage for SBP.  Patient will need close follow-up in the clinic given his tenuous status.  We will plan to see him in [facility] later this week and follow-up palliative care visit next week.  Patient has a strained relationship  with his spouse and limited support.  He was tearful during today's visit and endorses depressive symptoms including anger and sadness.  He is in agreement with trial of an antidepressant.  Patient has some weakness.  I encouraged him to consider home health but he declined.  He was concerned about clutter in his home.  Will refer him to see OT in the clinic.  Patient recognizes that his cancer is incurable.  He continues to desire treatment but says he is "ready to die."  He wanted to discuss end-of-life care and verbalized being afraid of pain and suffering during the dying process.  We discussed comfort measures and hospice.  We discussed CODE STATUS.  Patient says he would not want to be resuscitated or have his life prolonged artificially on machines.   He would be okay with hospitalization to treat the treatable as long as he had a reasonable prognosis but says he would likely just focus on comfort if he were nearing end-of-life.  I completed a MOST form today. The patient and family outlined their wishes for the following treatment decisions:  Cardiopulmonary Resuscitation: Do Not Attempt Resuscitation (DNR/No CPR)  Medical Interventions: Limited Additional Interventions: Use medical treatment, IV fluids and cardiac monitoring as indicated, DO NOT USE intubation or mechanical ventilation. May consider use of less invasive airway support such as BiPAP or CPAP. Also provide comfort measures. Transfer to the hospital if indicated. Avoid intensive care.   Antibiotics: Antibiotics if indicated  IV Fluids: IV fluids if indicated  Feeding Tube: No feeding tube   Case and plan discussed with Dr. Rogue Bussing.  PLAN: -Continue current scope of treatment -Increase fentanyl to 30mg Q72H -Bactrim 1 tablet BID x 10 days -1L NS IV today -Schedule therapeutic paracentesis ASAP. Plan to give 8-12G albumin IV due to recent orthostasis -Hold furosemide and K-Dur -Referral to OT due to weakness -Recommended home health but patient declined -Referral to home-based PC -MOST form completed/DNR -RTC next week   Patient expressed understanding and was in agreement with this plan. He also understands that He can call the clinic at any time with any questions, concerns, or complaints.     Time Total:45 minutes  Visit consisted of counseling and education dealing with the complex and emotionally intense issues of symptom management and palliative care in the setting of serious and potentially life-threatening illness.Greater than 50%  of this time was spent counseling and coordinating care related to the above assessment and plan.  Signed by: JAltha Harm PhD, NP-C [phone](Work Cell)

## 2019-02-03 NOTE — Telephone Encounter (Signed)
Contacted patient's wife Jacob Wilcox to schedule Palliative Consult, no answer.  Left message with reason for call along with my name and contact number.

## 2019-02-03 NOTE — Telephone Encounter (Signed)
Called patient to schedule Palliative Consult, no answer.  I was unable to leave a message due to no voicemail set up.  Will follow-up tomorrow.

## 2019-02-04 ENCOUNTER — Telehealth: Payer: Self-pay | Admitting: Nurse Practitioner

## 2019-02-04 MED ORDER — ALBUMIN HUMAN 25 % IV SOLN: INTRAVENOUS | 0 refills | Status: DC

## 2019-02-04 NOTE — Addendum Note (Signed)
Addended by: Altha Harm R on: 02/04/2019 09:02 AM   Modules accepted: Orders

## 2019-02-04 NOTE — Telephone Encounter (Signed)
Spoke with patient and have scheduled a Telephone Palliative Consult for 02/23/19 @ 3 PM.

## 2019-02-05 ENCOUNTER — Telehealth: Payer: Self-pay | Admitting: *Deleted

## 2019-02-05 ENCOUNTER — Inpatient Hospital Stay: Payer: Medicaid Other

## 2019-02-05 DIAGNOSIS — C220 Liver cell carcinoma: Secondary | ICD-10-CM

## 2019-02-05 NOTE — Telephone Encounter (Signed)
Did not call pt's personal cell as he had left this cell phone in the clinic a few days ago and still has not picked up his cell phone from our clinic.

## 2019-02-05 NOTE — Telephone Encounter (Signed)
Left vm - on pt's wife's cell phone #.  Pt no showed for [facility] today for possible iv fluids.  Called to inquire about patient. msg left requesting pt to return my phone call.

## 2019-02-06 ENCOUNTER — Other Ambulatory Visit: Payer: Self-pay

## 2019-02-06 ENCOUNTER — Inpatient Hospital Stay: Payer: Medicaid Other

## 2019-02-06 ENCOUNTER — Inpatient Hospital Stay (HOSPITAL_BASED_OUTPATIENT_CLINIC_OR_DEPARTMENT_OTHER): Payer: Medicaid Other | Admitting: Oncology

## 2019-02-06 ENCOUNTER — Ambulatory Visit
Admission: RE | Admit: 2019-02-06 | Discharge: 2019-02-06 | Disposition: A | Discharge status: Home / Self Care | Payer: Medicaid Other | Source: Ambulatory Visit | Attending: Hospice and Palliative Medicine | Admitting: Hospice and Palliative Medicine

## 2019-02-06 ENCOUNTER — Other Ambulatory Visit: Payer: Self-pay | Admitting: *Deleted

## 2019-02-06 VITALS — BP 124/84 | HR 97 | Temp 98.8°F

## 2019-02-06 DIAGNOSIS — R1030 Lower abdominal pain, unspecified: Secondary | ICD-10-CM

## 2019-02-06 DIAGNOSIS — R180 Malignant ascites: Secondary | ICD-10-CM | POA: Insufficient documentation

## 2019-02-06 DIAGNOSIS — C220 Liver cell carcinoma: Secondary | ICD-10-CM

## 2019-02-06 DIAGNOSIS — I951 Orthostatic hypotension: Secondary | ICD-10-CM

## 2019-02-06 DIAGNOSIS — E860 Dehydration: Secondary | ICD-10-CM

## 2019-02-06 LAB — COMPREHENSIVE METABOLIC PANEL
ALT: 57 U/L — ABNORMAL HIGH (ref 0–44)
AST: 308 U/L — ABNORMAL HIGH (ref 15–41)
Albumin: 2.6 g/dL — ABNORMAL LOW (ref 3.5–5.0)
Alkaline Phosphatase: 173 U/L — ABNORMAL HIGH (ref 38–126)
Anion gap: 8 (ref 5–15)
BUN: 21 mg/dL (ref 8–23)
CO2: 25 mmol/L (ref 22–32)
Calcium: 8.9 mg/dL (ref 8.9–10.3)
Chloride: 95 mmol/L — ABNORMAL LOW (ref 98–111)
Creatinine, Ser: 0.6 mg/dL — ABNORMAL LOW (ref 0.61–1.24)
GFR calc Af Amer: >60 mL/min (ref >60)
GFR calc non Af Amer: >60 mL/min (ref >60)
Glucose, Bld: 99 mg/dL (ref 70–99)
Potassium: 4.3 mmol/L (ref 3.5–5.1)
Sodium: 128 mmol/L — ABNORMAL LOW (ref 135–145)
Total Bilirubin: 1.8 mg/dL — ABNORMAL HIGH (ref 0.3–1.2)
Total Protein: 6.7 g/dL (ref 6.5–8.1)

## 2019-02-06 LAB — CBC WITH DIFFERENTIAL/PLATELET
Abs Immature Granulocytes: 0 10*3/uL (ref 0.00–0.07)
Basophils Absolute: 0 10*3/uL (ref 0.0–0.1)
Basophils Relative: 0 %
Eosinophils Absolute: 0 10*3/uL (ref 0.0–0.5)
Eosinophils Relative: 0 %
HCT: 34.3 % — ABNORMAL LOW (ref 39.0–52.0)
Hemoglobin: 12.3 g/dL — ABNORMAL LOW (ref 13.0–17.0)
Lymphocytes Relative: 11 %
Lymphs Abs: 0.3 10*3/uL — ABNORMAL LOW (ref 0.7–4.0)
MCH: 34 pg (ref 26.0–34.0)
MCHC: 35.9 g/dL (ref 30.0–36.0)
MCV: 94.8 fL (ref 80.0–100.0)
Monocytes Absolute: 0.2 10*3/uL (ref 0.1–1.0)
Monocytes Relative: 7 %
Neutro Abs: 2.3 10*3/uL (ref 1.7–7.7)
Neutrophils Relative %: 82 %
Platelets: 36 10*3/uL — ABNORMAL LOW (ref 150–400)
RBC: 3.62 MIL/uL — ABNORMAL LOW (ref 4.22–5.81)
RDW: 15.9 % — ABNORMAL HIGH (ref 11.5–15.5)
Smear Review: NORMAL
WBC: 2.8 10*3/uL — ABNORMAL LOW (ref 4.0–10.5)
nRBC: 0 % (ref 0.0–0.2)

## 2019-02-06 MED ORDER — MORPHINE SULFATE (PF) 2 MG/ML IV SOLN
1.0000 mg | Freq: Once | INTRAVENOUS | Status: AC
  Administered 2019-02-06: 1 mg via INTRAVENOUS

## 2019-02-06 MED ORDER — ONDANSETRON HCL 4 MG/2ML IJ SOLN
8.0000 mg | Freq: Once | INTRAMUSCULAR | Status: AC
  Administered 2019-02-06: 8 mg via INTRAVENOUS
  Filled 2019-02-06: qty 4

## 2019-02-06 MED ORDER — SODIUM CHLORIDE 0.9 % IV SOLN
INTRAVENOUS | Status: AC
  Administered 2019-02-06: 12:00:00 via INTRAVENOUS
  Filled 2019-02-06 (×2): qty 250

## 2019-02-06 MED ORDER — DEXAMETHASONE SODIUM PHOSPHATE 10 MG/ML IJ SOLN
10.0000 mg | Freq: Once | INTRAMUSCULAR | Status: AC
  Administered 2019-02-06: 12:00:00 10 mg via INTRAVENOUS
  Filled 2019-02-06: qty 1

## 2019-02-06 MED ORDER — MORPHINE SULFATE 2 MG/ML IJ SOLN
1.0000 mg | Freq: Once | INTRAMUSCULAR | Status: AC
  Filled 2019-02-06: qty 1

## 2019-02-06 NOTE — Addendum Note (Signed)
Addended by: Sabino Gasser on: 02/06/2019 09:01 AM   Modules accepted: Orders

## 2019-02-06 NOTE — Progress Notes (Signed)
Symptom Management Consult note [facility]  [phone] [phone]  Patient Care Team: Patient, No Pcp Per as PCP - General (General Practice) Clent Jacks, RN as Oncology Nurse Navigator   Name of the patient: Jacob Wilcox  [id number]  02-11-1956   Date of visit: 02/06/2019   Diagnosis-stage IV hepatocellular carcinoma  Chief complaint/ Reason for visit-abdominal pain; dehydration  Heme/Onc history:  Oncology History Overview Note  # July 2020-multiple liver lesions; lung lesions-stage IV/[place] stage C AFP-340,000; July 23 MRI liver-multifocal; 6 x 6 dome of liver; # ~3 x 3 cm suggestive of HCC; no biopsy;   #July30th, 2020-Tecentriq plus Avastin x1; STOPPED sec to liver decompensation  #Cirrhosis-child Pugh C decompensated-Lasix potassium  # hep C [untreated]/alcohol  # Early 1990s- Hep C [needle sharing >35 years in [place]; no treatment.     DIAGNOSIS: [place]  STAGE:    IV     ;GOALS: palliative  CURRENT/MOST RECENT THERAPY : Hospice    Cancer, hepatocellular ([place])  01/13/2019 Initial Diagnosis   Cancer, hepatocellular ([place])   01/22/2019 -  Chemotherapy   The patient had bevacizumab (AVASTIN) 1,000 mg in sodium chloride 0.9 % 100 mL chemo infusion, 1,075 mg, Intravenous,  Once, 1 of 6 cycles Administration: 1,000 mg (01/22/2019) atezolizumab (TECENTRIQ) 1,200 mg in sodium chloride 0.9 % 250 mL chemo infusion, 1,200 mg, Intravenous, Once, 1 of 6 cycles Administration: 1,200 mg (01/22/2019)  for chemotherapy treatment.       Interval history-patient presents to [facility] today for complaints of abdominal pain, weakness and feeling poorly.  He was recently evaluated by [facility] palliative care where they had increased his fentanyl patch to 25 MCG's every 72 hours.  He was scheduled for a paracentesis this morning and 2.8 L was removed.  He was instructed to hold furosemide and potassium due to dehydration.  He was  asked to follow-up in symptom management today for labs and possible IV fluids.  Patient just returned from having his paracentesis where he states he "feels no different".  His abdomen is still painful mostly directly below his umbilicus that radiates to the right lower quadrant.  This is chronic.  He admits to occasional nausea without vomiting.  He has not had to take any antiemetics.  Continues to have good bowel movements.  Last BM was this morning.  He denies any fevers or illnesses.  His appetite is fair.  He denies chest pain or shortness of breath.  ECOG FS:1 - Symptomatic but completely ambulatory  Review of systems- Review of Systems  Constitutional: Positive for malaise/fatigue and weight loss. Negative for chills and fever.  HENT: Negative for congestion, ear pain and tinnitus.   Eyes: Negative.  Negative for blurred vision and double vision.  Respiratory: Negative.  Negative for cough, sputum production and shortness of breath.   Cardiovascular: Negative.  Negative for chest pain, palpitations and leg swelling.  Gastrointestinal: Positive for abdominal pain. Negative for constipation, diarrhea, nausea and vomiting.  Genitourinary: Negative for dysuria, frequency and urgency.  Musculoskeletal: Negative for back pain and falls.  Skin: Negative.  Negative for rash.  Neurological: Positive for weakness and headaches.  Endo/Heme/Allergies: Negative.  Does not bruise/bleed easily.  Psychiatric/Behavioral: Negative.  Negative for depression. The patient is not nervous/anxious and does not have insomnia.      Current treatment-status post 1 cycle of Tecentriq Avastin.  Last given on 01/22/2019.  Allergies  Allergen Reactions   Fentanyl Other (See Comments)  Altered mental status   Poison Ivy Extract      Past Medical History:  Diagnosis Date   Cancer ([place])    Stage 4 Liver   Hepatitis C      Past Surgical History:  Procedure Laterality Date   HERNIA REPAIR     not  sure what it was--when he was a baby    Social History   Socioeconomic History   Marital status: Married    Spouse name: Not on file   Number of children: Not on file   Years of education: Not on file   Highest education level: Not on file  Occupational History   Not on file  Social Needs   Financial resource strain: Not on file   Food insecurity    Worry: Not on file    Inability: Not on file   Transportation needs    Medical: Not on file    Non-medical: Not on file  Tobacco Use   Smoking status: Never Smoker   Smokeless tobacco: Never Used  Substance and Sexual Activity   Alcohol use: Yes   Drug use: Never   Sexual activity: Not on file  Lifestyle   Physical activity    Days per week: Not on file    Minutes per session: Not on file   Stress: Not on file  Relationships   Social connections    Talks on phone: Not on file    Gets together: Not on file    Attends religious service: Not on file    Active member of club or organization: Not on file    Attends meetings of clubs or organizations: Not on file    Relationship status: Not on file   Intimate partner violence    Fear of current or ex partner: Not on file    Emotionally abused: Not on file    Physically abused: Not on file    Forced sexual activity: Not on file  Other Topics Concern   Not on file  Social History Narrative   Lives in [place]; with wife; Engineer, drilling; smoke 1ppd x 30 years; alcohol 6 pack beer/day.     No family history on file.   Current Outpatient Medications:    escitalopram (LEXAPRO) 10 MG tablet, Take 0.5 tablets (5 mg total) by mouth daily., Disp: 30 tablet, Rfl: 0   fentaNYL (DURAGESIC) 25 MCG/HR, Place 1 patch onto the skin every 3 (three) days., Disp: 5 patch, Rfl: 0   furosemide (LASIX) 40 MG tablet, Take 1 tablet (40 mg total) by mouth daily., Disp: 30 tablet, Rfl: 2   Loperamide HCl (IMODIUM A-D PO), Take by mouth daily as needed., Disp: , Rfl:    naloxone  (NARCAN) nasal spray 4 mg/0.1 mL, See instrucitons (Patient not taking: Reported on 02/12/2019), Disp: 1 each, Rfl: 0   potassium chloride SA (K-DUR) 20 MEQ tablet, 1 pill twice a day, Disp: 30 tablet, Rfl: 3   prochlorperazine (COMPAZINE) 10 MG tablet, Take 1 tablet (10 mg total) by mouth every 6 (six) hours as needed for nausea or vomiting., Disp: 40 tablet, Rfl: 1   sulfamethoxazole-trimethoprim (BACTRIM) 400-80 MG tablet, Take 1 tablet by mouth 2 (two) times daily., Disp: 20 tablet, Rfl: 0   vitamin B-12 (CYANOCOBALAMIN) 1000 MCG tablet, Take 1,000 mcg by mouth daily., Disp: , Rfl:    oxyCODONE (OXY IR/ROXICODONE) 5 MG immediate release tablet, Take 1 tablet (5 mg total) by mouth every 6 (six) hours as needed for severe pain.,  Disp: 60 tablet, Rfl: 0   polyethylene glycol (MIRALAX) 17 g packet, Take 17 g by mouth daily., Disp: 14 each, Rfl: 0   senna (SENOKOT) 8.6 MG TABS tablet, Take 1 tablet (8.6 mg total) by mouth daily., Disp: 120 tablet, Rfl: 0 No current facility-administered medications for this visit.   Facility-Administered Medications Ordered in Other Visits:    0.9 %  sodium chloride infusion, , Intravenous, Continuous, Elene Downum, Wandra Feinstein, NP, Stopped at 02/06/19 1427   morphine 2 MG/ML injection 1 mg, 1 mg, Intravenous, Once, Jacquelin Hawking, NP  Physical exam:  Vitals:   02/06/19 1137  BP: 124/84  Pulse: 97  Temp: 98.8 F (37.1 C)  TempSrc: Tympanic   Physical Exam Vitals reviewed: [place].  Constitutional:      Appearance: Normal appearance.  HENT:     Head: Normocephalic and atraumatic.  Eyes:     Pupils: Pupils are equal, round, and reactive to light.  Neck:     Musculoskeletal: Normal range of motion.  Cardiovascular:     Rate and Rhythm: Normal rate and regular rhythm.     Heart sounds: Normal heart sounds. No murmur.  Pulmonary:     Effort: Pulmonary effort is normal.     Breath sounds: Normal breath sounds. No wheezing.  Abdominal:     General:  Bowel sounds are normal. There is no distension.     Palpations: Abdomen is soft.     Tenderness: There is abdominal tenderness in the right upper quadrant, right lower quadrant and epigastric area.  Musculoskeletal: Normal range of motion.  Skin:    General: Skin is warm and dry.     Coloration: Skin is pale.     Findings: No rash.  Neurological:     Mental Status: He is alert and oriented to person, place, and time.  Psychiatric:        Judgment: Judgment normal.      CMP Latest Ref Rng & Units 02/12/2019  Glucose 70 - 99 mg/dL 103(H)  BUN 8 - 23 mg/dL 17  Creatinine 0.61 - 1.24 mg/dL 0.58(L)  Sodium 135 - 145 mmol/L 132(L)  Potassium 3.5 - 5.1 mmol/L 4.2  Chloride 98 - 111 mmol/L 99  CO2 22 - 32 mmol/L 26  Calcium 8.9 - 10.3 mg/dL 8.9  Total Protein 6.5 - 8.1 g/dL 7.1  Total Bilirubin 0.3 - 1.2 mg/dL 2.8(H)  Alkaline Phos 38 - 126 U/L 256(H)  AST 15 - 41 U/L 192(H)  ALT 0 - 44 U/L 90(H)   CBC Latest Ref Rng & Units 02/12/2019  WBC 4.0 - 10.5 K/uL 4.6  Hemoglobin 13.0 - 17.0 g/dL 12.7(L)  Hematocrit 39.0 - 52.0 % 36.9(L)  Platelets 150 - 400 K/uL 41(L)    No images are attached to the encounter.  US Paracentesis  Result Date: 02/10/2019 INDICATION: History of cirrhosis and multifocal hepatocellular carcinoma with recurrent symptomatic intra-abdominal ascites. Please perform ultrasound-guided paracentesis for therapeutic purposes. EXAM: ULTRASOUND-GUIDED PARACENTESIS COMPARISON:  Multiple previous ultrasound-guided paracenteses, most recently on 02/06/2019 yielding 2.9 L of peritoneal fluid. MEDICATIONS: None. COMPLICATIONS: None immediate. TECHNIQUE: Informed written consent was obtained from the patient after a discussion of the risks, benefits and alternatives to treatment. A timeout was performed prior to the initiation of the procedure. Initial ultrasound scanning demonstrates a small to moderate amount of ascites within the right mid hemiabdomen. The right mid  hemiabdomen was prepped and draped in the usual sterile fashion. 1% lidocaine with epinephrine was used for local  anesthesia. Under direct ultrasound guidance, a 19 gauge, 7-cm, Yueh catheter was introduced. An ultrasound image was saved for documentation purposed. The paracentesis was performed. The catheter was removed and a dressing was applied. The patient tolerated the procedure well without immediate post procedural complication. FINDINGS: A total of approximately 2.6 liters of serous, slightly blood tinged peritoneal fluid was removed. IMPRESSION: Successful ultrasound-guided paracentesis yielding 2.6 liters of peritoneal fluid. Electronically Signed   By: Sandi Mariscal M.D.   On: 02/10/2019 11:20   US Paracentesis  Result Date: 02/06/2019 INDICATION: Hepatocellular carcinoma and reaccumulation of malignant ascites with abdominal distension and pain. EXAM: ULTRASOUND GUIDED PARACENTESIS MEDICATIONS: None. COMPLICATIONS: None immediate. PROCEDURE: Informed written consent was obtained from the patient after a discussion of the risks, benefits and alternatives to treatment. A timeout was performed prior to the initiation of the procedure. Initial ultrasound was performed to localize ascites. The right lower abdomen was prepped and draped in the usual sterile fashion. 1% lidocaine was used for local anesthesia. Following this, a 6 Fr Safe-T-Centesis catheter was introduced. An ultrasound image was saved for documentation purposes. The paracentesis was performed. The catheter was removed and a dressing was applied. The patient tolerated the procedure well without immediate post procedural complication. FINDINGS: A total of approximately 2.9 L of blood tinged fluid was removed. IMPRESSION: Successful ultrasound-guided paracentesis yielding 2.9 liters of peritoneal fluid. Electronically Signed   By: Aletta Edouard M.D.   On: 02/06/2019 11:23   US Paracentesis  Result Date: 01/30/2019 INDICATION: Recent  diagnosis of cirrhosis and multifocal hepatocellular carcinoma, now with symptomatic intra-abdominal ascites. Please perform ultrasound-guided paracentesis for diagnostic and therapeutic purposes. EXAM: ULTRASOUND-GUIDED PARACENTESIS COMPARISON:  CT abdomen pelvis-01/12/2019; abdominal MRI-01/15/2019 MEDICATIONS: None. COMPLICATIONS: None immediate. TECHNIQUE: Informed written consent was obtained from the patient after a discussion of the risks, benefits and alternatives to treatment. A timeout was performed prior to the initiation of the procedure. Initial ultrasound scanning demonstrates a small to moderate amount of ascites within the right lower abdominal quadrant. The right lower abdomen was prepped and draped in the usual sterile fashion. 1% lidocaine with epinephrine was used for local anesthesia. Under direct ultrasound guidance, a 19 gauge, 7-cm, Yueh catheter was introduced. An ultrasound image was saved for documentation purposed. The paracentesis was performed. The catheter was removed and a dressing was applied. The patient tolerated the procedure well without immediate post procedural complication. FINDINGS: A total of approximately 1.6 liters of serous fluid was removed. Samples were sent to the laboratory as requested by the clinical team. IMPRESSION: Successful ultrasound-guided paracentesis yielding 1.6 liters of peritoneal fluid. Electronically Signed   By: Sandi Mariscal M.D.   On: 01/30/2019 11:50     Assessment and plan- Patient is a 63 y.o. male who presents to [facility] for complaints of abdominal pain.  He is status post paracentesis where 2.6 L of fluid was removed from his abdomen.  Stage IV hepatocellular carcinoma: Status post 1 cycle Tecentriq/Avastin.  Last given on 01/22/2019.  He is scheduled for cycle 2 on 02/12/2019.  Abdominal pain: Likely due to known malignancy.  Will check labs. Elevated LFTs and elevated billi-  Site of para looks unremarkable. No signs of infection.    Dehydration/weakness: Likely due to disease progression and lack of appetite. He is orthostatic.  Provided patient with supplements including ensures, boost and Ensure hydration electrolyte powder. Give fluids- see below.  Bilateral lower extremity swelling: Likely d/t hypoalbuminemia. Currently 2.8. Currently not on Lasix due to hypotension.  Once blood pressure improves patient may benefit from restarting Lasix d/t worseing and painful swelling in BLE. Suggest compression stockings.  Plan: Stat labs. (Hyponatremia 128, transaminitis, elevated bili 1.8) Orthostatics.  Positive.  Improved with IV fluids. Give 1.5 L NaCl. Give 1 mg morphine. Give 10 mg Decadron. Give 8 mg Zofran. Continue current narcotic regimen with fentanyl patch (25 mcg/72 hours) and oxycodone 5 mg every 6 hours as needed for pain. Add compression stocking for BLE swelling.   Disposition: RTC on 02/09/2019 for lab work and additional IV fluids. Continue narcotic regimen as prescribed.  Visit Diagnosis 1. Dehydration   2. Cancer, hepatocellular ([place])   3. Malignant ascites   4. Orthostatic hypotension    Patient expressed understanding and was in agreement with this plan. He also understands that He can call clinic at any time with any questions, concerns, or complaints.   Greater than 50% was spent in counseling and coordination of care with this patient including but not limited to discussion of the relevant topics above (See A&P) including, but not limited to diagnosis and management of acute and chronic medical conditions.   Thank you for allowing me to participate in the care of this very pleasant patient.    Jacquelin Hawking, NP [place] [facility] Cell - [phone] Pager- [phone] 02/16/2019 12:40 PM

## 2019-02-06 NOTE — Telephone Encounter (Signed)
I spoke with Billey Chang, NP. He would like the patient to come the clinic s/p u/s para.  I was able Reached patient. Patient stated that he was not aware of the apt yesterday. He is still c/o intermittent nausea and constipation. He states that he feels like he is dehydrated. I reminded the patient of his appointment today for the u/s para. He is aware of this appointment time.   Pt instructed to come to the clinic s/p u/s para apt.  Per Billey Chang, NP - pt will need labs/smc apt and possible iv hydration.  I spoke with [place] in special procedures. She will have pt's iv site remain s/p u/s para

## 2019-02-09 ENCOUNTER — Inpatient Hospital Stay (HOSPITAL_BASED_OUTPATIENT_CLINIC_OR_DEPARTMENT_OTHER): Payer: Medicaid Other | Admitting: Oncology

## 2019-02-09 ENCOUNTER — Inpatient Hospital Stay: Payer: Medicaid Other

## 2019-02-09 ENCOUNTER — Telehealth: Payer: Self-pay | Admitting: *Deleted

## 2019-02-09 ENCOUNTER — Other Ambulatory Visit: Payer: Self-pay

## 2019-02-09 ENCOUNTER — Encounter

## 2019-02-09 ENCOUNTER — Ambulatory Visit: Payer: Self-pay | Admitting: Gastroenterology

## 2019-02-09 VITALS — BP 117/79 | HR 79 | Temp 97.5°F | Resp 20 | Ht 70.0 in

## 2019-02-09 DIAGNOSIS — R188 Other ascites: Secondary | ICD-10-CM

## 2019-02-09 DIAGNOSIS — E860 Dehydration: Secondary | ICD-10-CM

## 2019-02-09 DIAGNOSIS — R410 Disorientation, unspecified: Secondary | ICD-10-CM

## 2019-02-09 DIAGNOSIS — C220 Liver cell carcinoma: Secondary | ICD-10-CM

## 2019-02-09 LAB — CBC WITH DIFFERENTIAL/PLATELET
Abs Immature Granulocytes: 0.01 10*3/uL (ref 0.00–0.07)
Basophils Absolute: 0 10*3/uL (ref 0.0–0.1)
Basophils Relative: 0 %
Eosinophils Absolute: 0.1 10*3/uL (ref 0.0–0.5)
Eosinophils Relative: 3 %
HCT: 36.9 % — ABNORMAL LOW (ref 39.0–52.0)
Hemoglobin: 13.1 g/dL (ref 13.0–17.0)
Immature Granulocytes: 0 %
Lymphocytes Relative: 33 %
Lymphs Abs: 1.4 10*3/uL (ref 0.7–4.0)
MCH: 33.6 pg (ref 26.0–34.0)
MCHC: 35.5 g/dL (ref 30.0–36.0)
MCV: 94.6 fL (ref 80.0–100.0)
Monocytes Absolute: 0.5 10*3/uL (ref 0.1–1.0)
Monocytes Relative: 11 %
Neutro Abs: 2.3 10*3/uL (ref 1.7–7.7)
Neutrophils Relative %: 53 %
Platelets: 36 10*3/uL — ABNORMAL LOW (ref 150–400)
RBC: 3.9 MIL/uL — ABNORMAL LOW (ref 4.22–5.81)
RDW: 16.1 % — ABNORMAL HIGH (ref 11.5–15.5)
Smear Review: DECREASED
WBC: 4.3 10*3/uL (ref 4.0–10.5)
nRBC: 0 % (ref 0.0–0.2)

## 2019-02-09 LAB — COMPREHENSIVE METABOLIC PANEL
ALT: 88 U/L — ABNORMAL HIGH (ref 0–44)
AST: 268 U/L — ABNORMAL HIGH (ref 15–41)
Albumin: 2.6 g/dL — ABNORMAL LOW (ref 3.5–5.0)
Alkaline Phosphatase: 230 U/L — ABNORMAL HIGH (ref 38–126)
Anion gap: 6 (ref 5–15)
BUN: 24 mg/dL — ABNORMAL HIGH (ref 8–23)
CO2: 28 mmol/L (ref 22–32)
Calcium: 9.2 mg/dL (ref 8.9–10.3)
Chloride: 98 mmol/L (ref 98–111)
Creatinine, Ser: 0.68 mg/dL (ref 0.61–1.24)
GFR calc Af Amer: >60 mL/min (ref >60)
GFR calc non Af Amer: >60 mL/min (ref >60)
Glucose, Bld: 94 mg/dL (ref 70–99)
Potassium: 4 mmol/L (ref 3.5–5.1)
Sodium: 132 mmol/L — ABNORMAL LOW (ref 135–145)
Total Bilirubin: 1.8 mg/dL — ABNORMAL HIGH (ref 0.3–1.2)
Total Protein: 6.7 g/dL (ref 6.5–8.1)

## 2019-02-09 LAB — AMMONIA: Ammonia: 21 umol/L (ref 9–35)

## 2019-02-09 MED ORDER — SODIUM CHLORIDE 0.9 % IV SOLN
INTRAVENOUS | Status: DC
  Filled 2019-02-09: qty 250

## 2019-02-09 MED ORDER — POLYETHYLENE GLYCOL 3350 17 G PO PACK: 17.0000 g | PACK | Freq: Every day | ORAL | 0 refills | Status: AC

## 2019-02-09 MED ORDER — SODIUM CHLORIDE 0.9 % IV SOLN
Freq: Once | INTRAVENOUS | Status: AC
  Administered 2019-02-09: 12:00:00 via INTRAVENOUS
  Filled 2019-02-09: qty 250

## 2019-02-09 MED ORDER — SENNA 8.6 MG PO TABS: 1.0000 | ORAL_TABLET | Freq: Every day | ORAL | 0 refills | Status: AC

## 2019-02-09 NOTE — Addendum Note (Signed)
Addended by: Faythe Casa E on: 02/09/2019 01:59 PM   Modules accepted: Orders

## 2019-02-09 NOTE — Patient Instructions (Signed)
So sorry that you are not feeling well today.  Today you were seen for constipation, abdominal distention and weakness.  While in clinic you were given 1 L of fluids.  We collected lab work which looked pretty stable.   I have placed an order for paracentesis.  We are waiting to hear back about a time for this.   As far as constipation is concerned please start taking MiraLAX and Senokot.  Both of these medications you can get over-the-counter but [facility]  They should be paid for by the [place].  Please pick these up today and begin taking them.  Take MiraLAX 1-2 times daily preferably once in the morning and once at bedtime and you can add in a Senokot.   Please keep you appt today with Dr. Marius Ditch.   We will call you with your paracentesis appointment when scheduled.  Faythe Casa, NP 02/09/2019 2:09 PM

## 2019-02-09 NOTE — Telephone Encounter (Signed)
Dr.B/s patient 

## 2019-02-09 NOTE — Telephone Encounter (Signed)
Spoke with patient via telephone. Made patient aware of the apt tomorrow for his u/s para (arrival 930 am). Pt repeated the instructions.

## 2019-02-09 NOTE — Telephone Encounter (Signed)
Patient called upset that he went for another Paracentesis today and was turned away for no order. He states no one ever answers the phone and that "there are 3 registration desks and 4 other people sitting around doing nothing" He wants something done for hisself. I do see that he is seeing [facility] today. He states it seems like Friday changed things and that no one can seem to get his appointments straight. Please advise

## 2019-02-09 NOTE — Telephone Encounter (Signed)
Patient called and states that he went to GI and they refused to see him, because he is broke and cannot afford to pay.He states he does not know why he was even sent there. He would like a return call

## 2019-02-09 NOTE — Progress Notes (Signed)
Symptom Management Consult note [facility]  [phone] [phone]  Patient Care Team: Patient, No Pcp Per as PCP - General (General Practice) Clent Jacks, RN as Oncology Nurse Navigator   Name of the patient: Jacob Wilcox  [id number]  06-08-1956   Date of visit: 02/09/2019   Diagnosis-stage IV hepatocellular carcinoma  Chief complaint/ Reason for visit-follow-up/possible IV fluids  Heme/Onc history:  Oncology History Overview Note  # July 2020-multiple liver lesions; lung lesions-stage IV/[place] stage C AFP-340,000; July 23 MRI liver-multifocal; 6 x 6 dome of liver; # ~3 x 3 cm suggestive of HCC; no biopsy;   #July30th, 2020-Tecentriq plus Avastin  #Cirrhosis-child Pugh C decompensated-Lasix potassium  # hep C [untreated]/alcohol  # Early 1990s- Hep C [needle sharing >35 years in [place]; no treatment.     DIAGNOSIS: [place]  STAGE:    IV     ;GOALS: palliative  CURRENT/MOST RECENT THERAPY : Tecentriq plus Avastin    Cancer, hepatocellular ([place])  01/13/2019 Initial Diagnosis   Cancer, hepatocellular ([place])   01/22/2019 -  Chemotherapy   The patient had bevacizumab (AVASTIN) 1,000 mg in sodium chloride 0.9 % 100 mL chemo infusion, 1,075 mg, Intravenous,  Once, 1 of 6 cycles Administration: 1,000 mg (01/22/2019) atezolizumab (TECENTRIQ) 1,200 mg in sodium chloride 0.9 % 250 mL chemo infusion, 1,200 mg, Intravenous, Once, 1 of 6 cycles Administration: 1,200 mg (01/22/2019)  for chemotherapy treatment.     Interval history-patient presents to symptom management for follow-up.  He was seen in clinic on Friday, 02/06/2023 dehydration and abdominal pain.  He was given IV fluids and IV pain medicine.  Had paracentesis completed on Friday where 2.6 L of fluid was removed.  He admitted to feeling "somewhat better".  His biggest complaint was generalized weakness and his inability to complete ADLs.  He was scheduled to return to  symptom management on Monday for additional IV fluids and lab work.  Today, he continues to feel fatigued and weak.  He states he "laid around all weekend".  He admits to increased abdominal swelling and discomfort.  His appetite remains poor and he is able to eat very little.  He has tried some supplements but struggles with fluids.  Pain in his abdomen is relatively constant at this point.  He is requesting another paracentesis if possible.  Admits to constipation.  Last bowel movement was on Friday morning prior to our visit.  He is currently not on prophylactic bowel regimen.  He is taking narcotics.  He is struggling with trying to get disability funds.  He states "I do not have a penny in my bank account".  He denies any recent fevers or illnesses.  He denies chest pain, nausea, vomiting or diarrhea.  He denies any urinary complaints.  ECOG FS:3 - Symptomatic, >50% confined to bed  Review of systems- Review of Systems  Constitutional: Positive for malaise/fatigue and weight loss. Negative for chills and fever.  HENT: Negative for congestion, ear pain and tinnitus.   Eyes: Negative.  Negative for blurred vision and double vision.  Respiratory: Negative.  Negative for cough, sputum production and shortness of breath.   Cardiovascular: Positive for leg swelling. Negative for chest pain and palpitations.  Gastrointestinal: Positive for abdominal pain and constipation. Negative for diarrhea, nausea and vomiting.  Genitourinary: Negative for dysuria, frequency and urgency.  Musculoskeletal: Negative for back pain and falls.  Skin: Negative.  Negative for rash.  Neurological: Positive for weakness. Negative for  headaches.  Endo/Heme/Allergies: Negative.  Does not bruise/bleed easily.  Psychiatric/Behavioral: Positive for depression. The patient is nervous/anxious. The patient does not have insomnia.      Current treatment- s/p cycle 1 Avastin Tecentriq  Allergies  Allergen Reactions    Fentanyl Other (See Comments)    Altered mental status   Poison Ivy Extract      Past Medical History:  Diagnosis Date   Cancer ([place])    Stage 4 Liver   Hepatitis C      Past Surgical History:  Procedure Laterality Date   HERNIA REPAIR     not sure what it was--when he was a baby    Social History   Socioeconomic History   Marital status: Married    Spouse name: Not on file   Number of children: Not on file   Years of education: Not on file   Highest education level: Not on file  Occupational History   Not on file  Social Needs   Financial resource strain: Not on file   Food insecurity    Worry: Not on file    Inability: Not on file   Transportation needs    Medical: Not on file    Non-medical: Not on file  Tobacco Use   Smoking status: Never Smoker   Smokeless tobacco: Never Used  Substance and Sexual Activity   Alcohol use: Yes   Drug use: Never   Sexual activity: Not on file  Lifestyle   Physical activity    Days per week: Not on file    Minutes per session: Not on file   Stress: Not on file  Relationships   Social connections    Talks on phone: Not on file    Gets together: Not on file    Attends religious service: Not on file    Active member of club or organization: Not on file    Attends meetings of clubs or organizations: Not on file    Relationship status: Not on file   Intimate partner violence    Fear of current or ex partner: Not on file    Emotionally abused: Not on file    Physically abused: Not on file    Forced sexual activity: Not on file  Other Topics Concern   Not on file  Social History Narrative   Lives in [place]; with wife; Engineer, drilling; smoke 1ppd x 30 years; alcohol 6 pack beer/day.     No family history on file.   Current Outpatient Medications:    escitalopram (LEXAPRO) 10 MG tablet, Take 0.5 tablets (5 mg total) by mouth daily., Disp: 30 tablet, Rfl: 0   fentaNYL (DURAGESIC) 25 MCG/HR, Place 1  patch onto the skin every 3 (three) days., Disp: 5 patch, Rfl: 0   furosemide (LASIX) 40 MG tablet, Take 1 tablet (40 mg total) by mouth daily., Disp: 30 tablet, Rfl: 2   Loperamide HCl (IMODIUM A-D PO), Take by mouth daily as needed., Disp: , Rfl:    naloxone (NARCAN) nasal spray 4 mg/0.1 mL, See instrucitons (Patient not taking: Reported on 02/12/2019), Disp: 1 each, Rfl: 0   oxyCODONE (OXY IR/ROXICODONE) 5 MG immediate release tablet, Take 1 tablet (5 mg total) by mouth every 6 (six) hours as needed for severe pain., Disp: 45 tablet, Rfl: 0   polyethylene glycol (MIRALAX) 17 g packet, Take 17 g by mouth daily., Disp: 14 each, Rfl: 0   potassium chloride SA (K-DUR) 20 MEQ tablet, 1 pill twice a day,  Disp: 30 tablet, Rfl: 3   prochlorperazine (COMPAZINE) 10 MG tablet, Take 1 tablet (10 mg total) by mouth every 6 (six) hours as needed for nausea or vomiting., Disp: 40 tablet, Rfl: 1   senna (SENOKOT) 8.6 MG TABS tablet, Take 1 tablet (8.6 mg total) by mouth daily., Disp: 120 tablet, Rfl: 0   sulfamethoxazole-trimethoprim (BACTRIM) 400-80 MG tablet, Take 1 tablet by mouth 2 (two) times daily., Disp: 20 tablet, Rfl: 0   vitamin B-12 (CYANOCOBALAMIN) 1000 MCG tablet, Take 1,000 mcg by mouth daily., Disp: , Rfl:  No current facility-administered medications for this visit.   Facility-Administered Medications Ordered in Other Visits:    0.9 %  sodium chloride infusion, , Intravenous, Continuous, Calhoun Reichardt, Wandra Feinstein, NP, Stopped at 02/06/19 1427   morphine 2 MG/ML injection 1 mg, 1 mg, Intravenous, Once, Jacquelin Hawking, NP  Physical exam:  Vitals:   02/09/19 1202  BP: 117/79  Pulse: 79  Resp: 20  Temp: (!) 97.5 F (36.4 C)  TempSrc: Oral  Height: 5' 10" (1.778 m)   Physical Exam Constitutional:      Appearance: Normal appearance. He is ill-appearing.  HENT:     Head: Normocephalic and atraumatic.  Eyes:     Pupils: Pupils are equal, round, and reactive to light.  Neck:      Musculoskeletal: Normal range of motion.  Cardiovascular:     Rate and Rhythm: Normal rate and regular rhythm.     Heart sounds: Normal heart sounds. No murmur.  Pulmonary:     Effort: Pulmonary effort is normal.     Breath sounds: Normal breath sounds. No wheezing.  Abdominal:     General: Bowel sounds are normal. There is distension.     Palpations: Abdomen is soft.     Tenderness: There is no abdominal tenderness.  Musculoskeletal: Normal range of motion.        General: Swelling (Bilateral lower extremities) present.  Skin:    General: Skin is warm and dry.     Findings: No rash.  Neurological:     Mental Status: He is alert and oriented to person, place, and time.     Motor: Weakness present.  Psychiatric:        Judgment: Judgment normal.      CMP Latest Ref Rng & Units 02/12/2019  Glucose 70 - 99 mg/dL 103(H)  BUN 8 - 23 mg/dL 17  Creatinine 0.61 - 1.24 mg/dL 0.58(L)  Sodium 135 - 145 mmol/L 132(L)  Potassium 3.5 - 5.1 mmol/L 4.2  Chloride 98 - 111 mmol/L 99  CO2 22 - 32 mmol/L 26  Calcium 8.9 - 10.3 mg/dL 8.9  Total Protein 6.5 - 8.1 g/dL 7.1  Total Bilirubin 0.3 - 1.2 mg/dL 2.8(H)  Alkaline Phos 38 - 126 U/L 256(H)  AST 15 - 41 U/L 192(H)  ALT 0 - 44 U/L 90(H)   CBC Latest Ref Rng & Units 02/12/2019  WBC 4.0 - 10.5 K/uL 4.6  Hemoglobin 13.0 - 17.0 g/dL 12.7(L)  Hematocrit 39.0 - 52.0 % 36.9(L)  Platelets 150 - 400 K/uL 41(L)    No images are attached to the encounter.  Mr Liver W Wo Contrast  Result Date: 01/15/2019 CLINICAL DATA:  63 year old male with liver lesion noted on prior CT examination. History of hepatitis C and cirrhosis. Abdominal pain. Follow-up study. EXAM: MRI ABDOMEN WITHOUT AND WITH CONTRAST TECHNIQUE: Multiplanar multisequence MR imaging of the abdomen was performed both before and after the administration of intravenous contrast. CONTRAST:  7 mL of Gadavist. COMPARISON:  No prior abdominal MRI.  CT the abdomen and pelvis FINDINGS: Lower  chest: Multiple small pulmonary nodules are noted in the visualize lung bases measuring up to 8 mm in the right lower lobe. Hepatobiliary: Liver has a nodular contour, indicative of underlying cirrhosis. There are multiple heterogeneous appearing hepatic lesions scattered throughout the liver, most evident in the right lobe of the liver. The largest of these is centered in segment 8 near the dome (axial image 18 of series 15) measuring 6.6 x 6.1 cm. This lesion is heterogeneous in signal intensity on T1 and T2 weighted images, but generally mildly T1 hyperintense and T2 hypointense with mild heterogeneous arterial phase hyperenhancement and some minimal delayed washout. Several other lesions are noted, including a small lesion in between segments 6 and 7 (axial image 34 of series 15) measuring 3.4 x 2.9 cm which demonstrates isointense to mildly hyperintense T1 signal intensity, T2 hypointensity, and demonstrates arterial phase hyperenhancement with definitive delayed washout. Innumerable other smaller lesions are also noted. The portal vein appears expanded, likely by tumor thrombus. This extends into both the left and right branches of the portal vein. The left branch remains partially patent, while the right branches completely occluded. No intra or extrahepatic biliary ductal dilatation. Gallbladder is unremarkable in appearance. Pancreas: No pancreatic mass. No pancreatic ductal dilatation. Small amount of peripancreatic fluid, without a well-defined organized fluid collection. Spleen:  Unremarkable. Adrenals/Urinary Tract: Bilateral kidneys and adrenal glands are normal in appearance. No hydroureteronephrosis in the visualized portions of the abdomen. Stomach/Bowel: Visualized portions are unremarkable. Vascular/Lymphatic: Aortic atherosclerosis, without definite aneurysm in the abdominal vasculature. Portal vein thrombus, as detailed above. 9 mm short axis gastrohepatic ligament lymph node (axial image 32 of  series 15) which demonstrates some mild diffusion restriction, suspicious for metastatic lymph node. Other:  Moderate volume of ascites. Musculoskeletal: No aggressive appearing osseous lesions are noted in the visualized portions of the skeleton. IMPRESSION: 1. Cirrhosis with multifocal infiltrative neoplasm most evident in the right lobe of the liver, as detailed above, most likely to reflect multifocal hepatocellular carcinoma. This is associated with probable tumor thrombus in the portal venous system, as detailed above. Widespread metastatic disease noted in the lung bases. Borderline enlarged gastrohepatic ligament lymph node demonstrating diffusion restriction also suspicious for nodal metastasis. 2. Moderate volume of ascites. 3. Additional incidental findings, as above. Electronically Signed   By: Vinnie Langton M.D.   On: 01/15/2019 12:22   US Paracentesis  Result Date: 02/10/2019 INDICATION: History of cirrhosis and multifocal hepatocellular carcinoma with recurrent symptomatic intra-abdominal ascites. Please perform ultrasound-guided paracentesis for therapeutic purposes. EXAM: ULTRASOUND-GUIDED PARACENTESIS COMPARISON:  Multiple previous ultrasound-guided paracenteses, most recently on 02/06/2019 yielding 2.9 L of peritoneal fluid. MEDICATIONS: None. COMPLICATIONS: None immediate. TECHNIQUE: Informed written consent was obtained from the patient after a discussion of the risks, benefits and alternatives to treatment. A timeout was performed prior to the initiation of the procedure. Initial ultrasound scanning demonstrates a small to moderate amount of ascites within the right mid hemiabdomen. The right mid hemiabdomen was prepped and draped in the usual sterile fashion. 1% lidocaine with epinephrine was used for local anesthesia. Under direct ultrasound guidance, a 19 gauge, 7-cm, Yueh catheter was introduced. An ultrasound image was saved for documentation purposed. The paracentesis was  performed. The catheter was removed and a dressing was applied. The patient tolerated the procedure well without immediate post procedural complication. FINDINGS: A total of approximately 2.6 liters of serous, slightly blood  tinged peritoneal fluid was removed. IMPRESSION: Successful ultrasound-guided paracentesis yielding 2.6 liters of peritoneal fluid. Electronically Signed   By: Sandi Mariscal M.D.   On: 02/10/2019 11:20   US Paracentesis  Result Date: 02/06/2019 INDICATION: Hepatocellular carcinoma and reaccumulation of malignant ascites with abdominal distension and pain. EXAM: ULTRASOUND GUIDED PARACENTESIS MEDICATIONS: None. COMPLICATIONS: None immediate. PROCEDURE: Informed written consent was obtained from the patient after a discussion of the risks, benefits and alternatives to treatment. A timeout was performed prior to the initiation of the procedure. Initial ultrasound was performed to localize ascites. The right lower abdomen was prepped and draped in the usual sterile fashion. 1% lidocaine was used for local anesthesia. Following this, a 6 Fr Safe-T-Centesis catheter was introduced. An ultrasound image was saved for documentation purposes. The paracentesis was performed. The catheter was removed and a dressing was applied. The patient tolerated the procedure well without immediate post procedural complication. FINDINGS: A total of approximately 2.9 L of blood tinged fluid was removed. IMPRESSION: Successful ultrasound-guided paracentesis yielding 2.9 liters of peritoneal fluid. Electronically Signed   By: Aletta Edouard M.D.   On: 02/06/2019 11:23   US Paracentesis  Result Date: 01/30/2019 INDICATION: Recent diagnosis of cirrhosis and multifocal hepatocellular carcinoma, now with symptomatic intra-abdominal ascites. Please perform ultrasound-guided paracentesis for diagnostic and therapeutic purposes. EXAM: ULTRASOUND-GUIDED PARACENTESIS COMPARISON:  CT abdomen pelvis-01/12/2019; abdominal  MRI-01/15/2019 MEDICATIONS: None. COMPLICATIONS: None immediate. TECHNIQUE: Informed written consent was obtained from the patient after a discussion of the risks, benefits and alternatives to treatment. A timeout was performed prior to the initiation of the procedure. Initial ultrasound scanning demonstrates a small to moderate amount of ascites within the right lower abdominal quadrant. The right lower abdomen was prepped and draped in the usual sterile fashion. 1% lidocaine with epinephrine was used for local anesthesia. Under direct ultrasound guidance, a 19 gauge, 7-cm, Yueh catheter was introduced. An ultrasound image was saved for documentation purposed. The paracentesis was performed. The catheter was removed and a dressing was applied. The patient tolerated the procedure well without immediate post procedural complication. FINDINGS: A total of approximately 1.6 liters of serous fluid was removed. Samples were sent to the laboratory as requested by the clinical team. IMPRESSION: Successful ultrasound-guided paracentesis yielding 1.6 liters of peritoneal fluid. Electronically Signed   By: Sandi Mariscal M.D.   On: 01/30/2019 11:50    Assessment and plan- Patient is a 63 y.o. male who presents to symptom management for follow-up, labs and additional IV fluid.  He is status post paracentesis on Friday with 2.6 L of fluid was removed.  Stage IV hepatocellular carcinoma: Status post 1 cycle Tecentriq/Avastin.  Last given on 01/22/2019.  He is scheduled for cycle 2 on 02/12/2019.  Abdominal swelling/pain: Likely due to ascites from disease progression.  Had 2.6 L of fluid removed on Friday, 02/06/2019.   Dehydration/weakness: Likely due to disease progression and lack of appetite. IV fluids as below.  Opiate induced constipation: We discussed trying to stabilize/normalize his BMs as he has recently become constipated. He is currently on narcotics and not on bowel prohalaxis.  Would recommend MiraLAX +/- senna  for treatment/management of constipation.  Second line would be Linzess +/- enema.  IV fluids were given in clinic to help hydrate and hopefully reduce effects of constipation and lessen spasm pain.  We discussed the foods that are high in insoluble fiber such as fruits and vegetables can be helpful when experiencing constipation and also discussed the importance of staying well-hydrated.  BLE Swelling: Recently due to hypotension dehydration.  He is taking 100 mg Lasix daily.  Patient has significant bilateral lower extremity swelling (+3 pitting edema). BP has stabilized. Will talk with Dr. Rogue Bussing and Merrily Pew about adding Lasix back.   Plan: Stat labs. (Bun 24, alk phos 230, ast 268, alt 88, total billi 1.8, plt 36,000) Vital signs. Stable. Give 1 L NaCl.  Orders placed for repeat paracentesis for ascites.  Start bowel regime.  Push supplements. Provided samples of boost and ensure.  Patient may benefit from Pleurex drain if he continues to accumulate ascites weekly. Dr. Rogue Bussing will discuss with Merrily Pew and patient at next clinic visit.  Can consider adding Lasix back into daily medications given BP has improved.   Disposition: RTC as scheduled.  Schedule paracentesis as soon as possible.  Start Miralax and senna for bowel prophylaxis.   Visit Diagnosis 1. Other ascites   2. Cancer, hepatocellular ([place])     Patient expressed understanding and was in agreement with this plan. He also understands that He can call clinic at any time with any questions, concerns, or complaints.   Greater than 50% was spent in counseling and coordination of care with this patient including but not limited to discussion of the relevant topics above (See A&P) including, but not limited to diagnosis and management of acute and chronic medical conditions.   Thank you for allowing me to participate in the care of this very pleasant patient.    Jacquelin Hawking, NP [place] at [facility] Cell - [phone] Pager- [phone] 02/12/2019 12:51 PM

## 2019-02-09 NOTE — Addendum Note (Signed)
Addended by: Sabino Gasser on: 02/09/2019 02:21 PM   Modules accepted: Orders

## 2019-02-09 NOTE — Telephone Encounter (Signed)
Please see below.

## 2019-02-09 NOTE — Telephone Encounter (Signed)
I spoke with the patient earlier and explained to him that he did not have a paracentesis planned. Pt also arrived an hour late to the cancer center today as well. Patient currently being evaluated by Sonia Baller, NP. Team will address his concerns.

## 2019-02-10 ENCOUNTER — Ambulatory Visit
Admission: RE | Admit: 2019-02-10 | Discharge: 2019-02-10 | Disposition: A | Discharge status: Home / Self Care | Payer: Medicaid Other | Source: Ambulatory Visit | Attending: Oncology | Admitting: Oncology

## 2019-02-10 ENCOUNTER — Other Ambulatory Visit: Payer: Self-pay

## 2019-02-10 DIAGNOSIS — R188 Other ascites: Secondary | ICD-10-CM | POA: Diagnosis present

## 2019-02-10 NOTE — Procedures (Signed)
Pre Procedural Dx: Symptomatic Ascites Post Procedural Dx: Same  Successful US guided paracentesis yielding 2.6 L of serous, slightly blood tinged ascitic fluid.  EBL: None Complications: None immediate  Ronny Bacon, MD Pager #: [phone]

## 2019-02-11 ENCOUNTER — Inpatient Hospital Stay: Payer: Medicaid Other | Admitting: Occupational Therapy

## 2019-02-11 ENCOUNTER — Other Ambulatory Visit: Payer: Self-pay

## 2019-02-11 DIAGNOSIS — M6281 Muscle weakness (generalized): Secondary | ICD-10-CM

## 2019-02-11 NOTE — Telephone Encounter (Signed)
Per Dr. Jacinto Reap - he will discuss this with the patient face to face at his next visit.

## 2019-02-11 NOTE — Therapy (Signed)
[facility] [address] [place], [place], [place], [address] Phone: [phone]   Fax:  [phone]  Occupational Therapy Evaluation  Patient Details  Name: Jacob Wilcox MRN: [id number] Date of Birth: [birth year] No data recorded  Encounter Date: 02/11/2019    Past Medical History:  Diagnosis Date  . Cancer ([place])    Stage 4 Liver  . Hepatitis C     Past Surgical History:  Procedure Laterality Date  . HERNIA REPAIR     not sure what it was--when he was a baby    There were no vitals filed for this visit.  Subjective Assessment - 02/11/19 1140    Subjective   I have 4 months to live - I get up at home if I need to and walk to bathroom and do my own bathing and dressing - just need to pace myself     Pt refer to me for rehab screen. Pt did not know that he is going to see rehab today. Pt done transfer and 2 balance test activities - when I stopped test.  And report that he is not interested in any exercises - that he does his own bathing and dressing - on his own time -and take rest breaks. Denies any falls. Report he gets up to bathroom - pt report that he has 4 months to live and and otherwise he is interested "only to be able to get from his chair to his coffin" OT listened and talked with him about quality of life and fall risk - and what his goals are for that time.  Provided him with hand out on fall prevention at home and energy conservation to read on his own time   He has tub shower combo - but has hand held shower and shower chair -and hold on the shower door when getting in and out  Denies any falls the last 3-6 months    He had yesterday another Paracentesis - and denies pain this date with OT and feeling better than the last week when he was seen by Anderson Malta Burn NP at the    symptom management    He was seen in clinic on Friday, 02/06/2023 dehydration and abdominal pain.  He was given IV fluids and IV pain  medicine.  Had paracentesis completed on Friday where 2.6 L of fluid was removed.  He admitted to feeling "somewhat better".  His biggest complaint was generalized weakness and his inability to complete ADLs.  He was scheduled to return to symptom management on Monday for additional IV fluids and lab work.   He did bring up during session that he is struggling with trying to get disability funds. Sounds like pt declined [place] services in past                                          Visit Diagnosis: 1. Muscle weakness (generalized)       Problem List Patient Active Problem List   Diagnosis Date Noted  . Goals of care, counseling/discussion 01/14/2019  . Cancer, hepatocellular ([place]) 01/13/2019    Rosalyn Gess OTR/L,CLT  02/11/2019, 5:39 PM  [facility] [address] [place], [place], [place], [address] Phone: [phone]   Fax:  [phone]  Name: Jacob Wilcox MRN: [id number] Date of Birth: [birth year]

## 2019-02-12 ENCOUNTER — Inpatient Hospital Stay (HOSPITAL_BASED_OUTPATIENT_CLINIC_OR_DEPARTMENT_OTHER): Payer: Medicaid Other | Admitting: Internal Medicine

## 2019-02-12 ENCOUNTER — Inpatient Hospital Stay: Payer: Medicaid Other

## 2019-02-12 ENCOUNTER — Telehealth: Payer: Self-pay | Admitting: *Deleted

## 2019-02-12 ENCOUNTER — Other Ambulatory Visit: Payer: Self-pay | Admitting: *Deleted

## 2019-02-12 ENCOUNTER — Other Ambulatory Visit: Payer: Self-pay

## 2019-02-12 ENCOUNTER — Encounter: Payer: Self-pay | Admitting: Internal Medicine

## 2019-02-12 ENCOUNTER — Inpatient Hospital Stay (HOSPITAL_BASED_OUTPATIENT_CLINIC_OR_DEPARTMENT_OTHER): Payer: Medicaid Other | Admitting: Hospice and Palliative Medicine

## 2019-02-12 DIAGNOSIS — C220 Liver cell carcinoma: Secondary | ICD-10-CM

## 2019-02-12 DIAGNOSIS — G893 Neoplasm related pain (acute) (chronic): Secondary | ICD-10-CM

## 2019-02-12 DIAGNOSIS — Z515 Encounter for palliative care: Secondary | ICD-10-CM

## 2019-02-12 DIAGNOSIS — R180 Malignant ascites: Secondary | ICD-10-CM

## 2019-02-12 LAB — CBC WITH DIFFERENTIAL/PLATELET
Abs Immature Granulocytes: 0.02 10*3/uL (ref 0.00–0.07)
Basophils Absolute: 0 10*3/uL (ref 0.0–0.1)
Basophils Relative: 0 %
Eosinophils Absolute: 0.1 10*3/uL (ref 0.0–0.5)
Eosinophils Relative: 3 %
HCT: 36.9 % — ABNORMAL LOW (ref 39.0–52.0)
Hemoglobin: 12.7 g/dL — ABNORMAL LOW (ref 13.0–17.0)
Immature Granulocytes: 0 %
Lymphocytes Relative: 36 %
Lymphs Abs: 1.7 10*3/uL (ref 0.7–4.0)
MCH: 33.1 pg (ref 26.0–34.0)
MCHC: 34.4 g/dL (ref 30.0–36.0)
MCV: 96.1 fL (ref 80.0–100.0)
Monocytes Absolute: 0.5 10*3/uL (ref 0.1–1.0)
Monocytes Relative: 10 %
Neutro Abs: 2.3 10*3/uL (ref 1.7–7.7)
Neutrophils Relative %: 51 %
Platelets: 41 10*3/uL — ABNORMAL LOW (ref 150–400)
RBC: 3.84 MIL/uL — ABNORMAL LOW (ref 4.22–5.81)
RDW: 16.2 % — ABNORMAL HIGH (ref 11.5–15.5)
WBC: 4.6 10*3/uL (ref 4.0–10.5)
nRBC: 0 % (ref 0.0–0.2)

## 2019-02-12 LAB — COMPREHENSIVE METABOLIC PANEL
ALT: 90 U/L — ABNORMAL HIGH (ref 0–44)
AST: 192 U/L — ABNORMAL HIGH (ref 15–41)
Albumin: 2.6 g/dL — ABNORMAL LOW (ref 3.5–5.0)
Alkaline Phosphatase: 256 U/L — ABNORMAL HIGH (ref 38–126)
Anion gap: 7 (ref 5–15)
BUN: 17 mg/dL (ref 8–23)
CO2: 26 mmol/L (ref 22–32)
Calcium: 8.9 mg/dL (ref 8.9–10.3)
Chloride: 99 mmol/L (ref 98–111)
Creatinine, Ser: 0.58 mg/dL — ABNORMAL LOW (ref 0.61–1.24)
GFR calc Af Amer: >60 mL/min (ref >60)
GFR calc non Af Amer: >60 mL/min (ref >60)
Glucose, Bld: 103 mg/dL — ABNORMAL HIGH (ref 70–99)
Potassium: 4.2 mmol/L (ref 3.5–5.1)
Sodium: 132 mmol/L — ABNORMAL LOW (ref 135–145)
Total Bilirubin: 2.8 mg/dL — ABNORMAL HIGH (ref 0.3–1.2)
Total Protein: 7.1 g/dL (ref 6.5–8.1)

## 2019-02-12 LAB — URINALYSIS, COMPLETE (UACMP) WITH MICROSCOPIC
Bilirubin Urine: NEGATIVE
Glucose, UA: NEGATIVE mg/dL
Hgb urine dipstick: NEGATIVE
Ketones, ur: NEGATIVE mg/dL
Leukocytes,Ua: NEGATIVE
Nitrite: NEGATIVE
Protein, ur: NEGATIVE mg/dL
Specific Gravity, Urine: 1.019 (ref 1.005–1.030)
pH: 6 (ref 5.0–8.0)

## 2019-02-12 MED ORDER — OXYCODONE HCL 5 MG PO TABS: 5.0000 mg | ORAL_TABLET | Freq: Four times a day (QID) | ORAL | 0 refills | Status: DC | PRN

## 2019-02-12 NOTE — Addendum Note (Signed)
Addended by: Irean Hong on: 02/12/2019 02:57 PM   Modules accepted: Orders

## 2019-02-12 NOTE — Assessment & Plan Note (Addendum)
#  Stage IV /hepatocellular cancer-B[place] stage-C/HCC.  Alpha-fetoprotein 3 40,000.  Patient status post 1 cycle of Tecentriq plus Avastin.    #Patient is clinically progressive disease/along with decompensation of the liver.  This is also complicated by patient's poor social support.  #Had a long discussion the patient and his wife [over the phone]-regarding the poor prognosis poor tolerance to therapy given his decompensated cirrhosis.  I think he is at significantly high risk of treatment late complications from his underlying decompensated cirrhosis.  #Cirrhosis child Pugh C/decompensated-ascites/leg swelling-on Lasix/potassium.  Status post multiple paracentesis.  Discussed regarding Pleurx catheter placement.  #Abdominal pain-continue oxycodone 5 mg every 6 hours as needed for pain.  #Goals of care: Unfortunately prognosis extremely poor life expectancy is in the order of few months [less than 3 months].  I would recommend hospice.  Patient initially reluctant; however agrees that hospice might be helpful in keeping his quality of life steady at this time.  His wife also agrees.  Discussed with [employer].;  Will initiate a hospice referral.  # 40 minutes face-to-face with the patient discussing the above plan of care; more than 50% of time spent on prognosis/ natural history; counseling and coordination.

## 2019-02-12 NOTE — Telephone Encounter (Signed)
Called spec. Scheduling dept to arrange foran  abdominal pleurx catheter placement per v/o Josh Borders. Requested the pleurx placement to be stat/asap.  Pending radiology approval.

## 2019-02-12 NOTE — Addendum Note (Signed)
Addended by: Sabino Gasser on: 02/12/2019 02:56 PM   Modules accepted: Orders

## 2019-02-12 NOTE — Telephone Encounter (Signed)
Contacted patient as he was over an hour late for his apt. He started using multiple fowl words of profanity on the phone. Stating that I was xxxx lying about his appointment time and that the apt was scheduled at 1030 am. Pt called RN "a bitch" on phone, then said. "Sorry I was talking to my wife."  I politely asked the patient not to use words of profanity as these words were not appropriate. I acknowledge that he sounded upset; however, the words of choice were not appropriate. Pt hung up on me.

## 2019-02-12 NOTE — Progress Notes (Signed)
[facility]  Telephone:([phone] [phone]   Name: Jacob Wilcox Date: 02/12/2019 MRN: [id number]  DOB: [birth year]  Patient Care Team: Patient, No Pcp Per as PCP - General (General Practice) Clent Jacks, RN as Oncology Nurse Navigator    REASON FOR CONSULTATION: Palliative Care consult requested for this 63 y.o. 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  He presented to ER in July 2020 for complaints of lower abdominal pain and distention.  CT was performed with findings concerning for metastatic hepatocellular carcinoma. Alpha-fetoprotein 3 was 40,000.  This was followed by MRI which also suggestive of hepatocellular carcinoma.  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  He initiated palliative Tecentriq and Avastin on 01/22/2019. Palliative care was consulted to discuss goals of care and ongoing symptom management.   SOCIAL HISTORY:     reports that he has never smoked. He has never used smokeless tobacco. He reports current alcohol use. He reports that he does not use drugs.   Moved from [place] with his wife approximately 1 year ago.  Has 2 children still living in [place].  Is currently living in his mother-in-law's home while she is in nursing facility.    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  ADVANCE DIRECTIVES:  Does not have  CODE STATUS: DNR (MOST form completed on 02/03/19)  PAST MEDICAL HISTORY: Past Medical History:  Diagnosis Date   Cancer ([place])    Stage 4 Liver   Hepatitis C     PAST SURGICAL HISTORY:  Past Surgical History:  Procedure Laterality Date   HERNIA REPAIR     not sure what it was--when he was a baby    HEMATOLOGY/ONCOLOGY HISTORY:  Oncology History  Overview Note  # July 2020-multiple liver lesions; lung lesions-stage IV/Barcelona stage C AFP-340,000; July 23 MRI liver-multifocal; 6 x 6 dome of liver; # ~3 x 3 cm suggestive of HCC; no biopsy;   #July30th, 2020-Tecentriq plus Avastin x1; STOPPED sec to liver decompensation  #Cirrhosis-child Pugh C decompensated-Lasix potassium  # hep C [untreated]/alcohol  # Early 1990s- Hep C [needle sharing >35 years in [place]; no treatment.     DIAGNOSIS: [name]  STAGE:    IV     ;GOALS: palliative  CURRENT/MOST RECENT THERAPY : Hospice    Cancer, hepatocellular ([place])  01/13/2019 Initial Diagnosis   Cancer, hepatocellular ([place])   01/22/2019 -  Chemotherapy   The patient had bevacizumab (AVASTIN) 1,000 mg in sodium chloride 0.9 % 100 mL chemo infusion, 1,075 mg, Intravenous,  Once, 1 of 6 cycles Administration: 1,000 mg (01/22/2019) atezolizumab (TECENTRIQ) 1,200 mg in sodium chloride 0.9 % 250 mL chemo infusion, 1,200 mg, Intravenous, Once, 1 of 6 cycles Administration: 1,200 mg (01/22/2019)  for chemotherapy treatment.      ALLERGIES:  is allergic to fentanyl and poison ivy extract.  MEDICATIONS:  Current Outpatient Medications  Medication Sig Dispense Refill   escitalopram (LEXAPRO) 10 MG tablet Take 0.5 tablets (5 mg total) by mouth daily. 30 tablet 0   fentaNYL (DURAGESIC) 25 MCG/HR Place 1 patch onto the skin every 3 (three) days. 5 patch 0   furosemide (LASIX) 40 MG tablet Take 1 tablet (40 mg total) by mouth daily. 30 tablet 2   Loperamide HCl (IMODIUM A-D PO) Take by mouth  daily as needed.     naloxone (NARCAN) nasal spray 4 mg/0.1 mL See instrucitons (Patient not taking: Reported on 02/12/2019) 1 each 0   oxyCODONE (OXY IR/ROXICODONE) 5 MG immediate release tablet Take 1 tablet (5 mg total) by mouth every 6 (six) hours as needed for severe pain. 45 tablet 0   polyethylene glycol (MIRALAX) 17 g packet Take 17 g by mouth daily. 14 each 0   potassium chloride SA (K-DUR) 20  MEQ tablet 1 pill twice a day 30 tablet 3   prochlorperazine (COMPAZINE) 10 MG tablet Take 1 tablet (10 mg total) by mouth every 6 (six) hours as needed for nausea or vomiting. 40 tablet 1   senna (SENOKOT) 8.6 MG TABS tablet Take 1 tablet (8.6 mg total) by mouth daily. 120 tablet 0   sulfamethoxazole-trimethoprim (BACTRIM) 400-80 MG tablet Take 1 tablet by mouth 2 (two) times daily. 20 tablet 0   vitamin B-12 (CYANOCOBALAMIN) 1000 MCG tablet Take 1,000 mcg by mouth daily.     No current facility-administered medications for this visit.    Facility-Administered Medications Ordered in Other Visits  Medication Dose Route Frequency Provider Last Rate Last Dose   0.9 %  sodium chloride infusion   Intravenous Continuous Jacquelin Hawking, NP   Stopped at 02/06/19 1427   morphine 2 MG/ML injection 1 mg  1 mg Intravenous Once Jacquelin Hawking, NP        VITAL SIGNS: There were no vitals taken for this visit. There were no vitals filed for this visit.  Estimated body mass index is 22.96 kg/m as calculated from the following:   Height as of 02/09/19: '5\' 10"'  (1.778 m).   Weight as of 01/29/19: 160 lb (72.6 kg).  LABS: CBC:    Component Value Date/Time   WBC 4.6 02/12/2019 1002   HGB 12.7 (L) 02/12/2019 1002   HCT 36.9 (L) 02/12/2019 1002   PLT 41 (L) 02/12/2019 1002   MCV 96.1 02/12/2019 1002   NEUTROABS 2.3 02/12/2019 1002   LYMPHSABS 1.7 02/12/2019 1002   MONOABS 0.5 02/12/2019 1002   EOSABS 0.1 02/12/2019 1002   BASOSABS 0.0 02/12/2019 1002   Comprehensive Metabolic Panel:    Component Value Date/Time   NA 132 (L) 02/12/2019 1002   K 4.2 02/12/2019 1002   CL 99 02/12/2019 1002   CO2 26 02/12/2019 1002   BUN 17 02/12/2019 1002   CREATININE 0.58 (L) 02/12/2019 1002   GLUCOSE 103 (H) 02/12/2019 1002   CALCIUM 8.9 02/12/2019 1002   AST 192 (H) 02/12/2019 1002   ALT 90 (H) 02/12/2019 1002   ALKPHOS 256 (H) 02/12/2019 1002   BILITOT 2.8 (H) 02/12/2019 1002   PROT 7.1  02/12/2019 1002   ALBUMIN 2.6 (L) 02/12/2019 1002    RADIOGRAPHIC STUDIES: Mr Liver W Wo Contrast  Result Date: 01/15/2019 CLINICAL DATA:  63 year old male with liver lesion noted on prior CT examination. History of hepatitis C and cirrhosis. Abdominal pain. Follow-up study. EXAM: MRI ABDOMEN WITHOUT AND WITH CONTRAST TECHNIQUE: Multiplanar multisequence MR imaging of the abdomen was performed both before and after the administration of intravenous contrast. CONTRAST:  7 mL of Gadavist. COMPARISON:  No prior abdominal MRI.  CT the abdomen and pelvis FINDINGS: Lower chest: Multiple small pulmonary nodules are noted in the visualize lung bases measuring up to 8 mm in the right lower lobe. Hepatobiliary: Liver has a nodular contour, indicative of underlying cirrhosis. There are multiple heterogeneous appearing hepatic lesions scattered throughout the liver,  most evident in the right lobe of the liver. The largest of these is centered in segment 8 near the dome (axial image 18 of series 15) measuring 6.6 x 6.1 cm. This lesion is heterogeneous in signal intensity on T1 and T2 weighted images, but generally mildly T1 hyperintense and T2 hypointense with mild heterogeneous arterial phase hyperenhancement and some minimal delayed washout. Several other lesions are noted, including a small lesion in between segments 6 and 7 (axial image 34 of series 15) measuring 3.4 x 2.9 cm which demonstrates isointense to mildly hyperintense T1 signal intensity, T2 hypointensity, and demonstrates arterial phase hyperenhancement with definitive delayed washout. Innumerable other smaller lesions are also noted. The portal vein appears expanded, likely by tumor thrombus. This extends into both the left and right branches of the portal vein. The left branch remains partially patent, while the right branches completely occluded. No intra or extrahepatic biliary ductal dilatation. Gallbladder is unremarkable in appearance. Pancreas: No  pancreatic mass. No pancreatic ductal dilatation. Small amount of peripancreatic fluid, without a well-defined organized fluid collection. Spleen:  Unremarkable. Adrenals/Urinary Tract: Bilateral kidneys and adrenal glands are normal in appearance. No hydroureteronephrosis in the visualized portions of the abdomen. Stomach/Bowel: Visualized portions are unremarkable. Vascular/Lymphatic: Aortic atherosclerosis, without definite aneurysm in the abdominal vasculature. Portal vein thrombus, as detailed above. 9 mm short axis gastrohepatic ligament lymph node (axial image 32 of series 15) which demonstrates some mild diffusion restriction, suspicious for metastatic lymph node. Other:  Moderate volume of ascites. Musculoskeletal: No aggressive appearing osseous lesions are noted in the visualized portions of the skeleton. IMPRESSION: 1. Cirrhosis with multifocal infiltrative neoplasm most evident in the right lobe of the liver, as detailed above, most likely to reflect multifocal hepatocellular carcinoma. This is associated with probable tumor thrombus in the portal venous system, as detailed above. Widespread metastatic disease noted in the lung bases. Borderline enlarged gastrohepatic ligament lymph node demonstrating diffusion restriction also suspicious for nodal metastasis. 2. Moderate volume of ascites. 3. Additional incidental findings, as above. Electronically Signed   By: Vinnie Langton M.D.   On: 01/15/2019 12:22   US Paracentesis  Result Date: 02/10/2019 INDICATION: History of cirrhosis and multifocal hepatocellular carcinoma with recurrent symptomatic intra-abdominal ascites. Please perform ultrasound-guided paracentesis for therapeutic purposes. EXAM: ULTRASOUND-GUIDED PARACENTESIS COMPARISON:  Multiple previous ultrasound-guided paracenteses, most recently on 02/06/2019 yielding 2.9 L of peritoneal fluid. MEDICATIONS: None. COMPLICATIONS: None immediate. TECHNIQUE: Informed written consent was obtained  from the patient after a discussion of the risks, benefits and alternatives to treatment. A timeout was performed prior to the initiation of the procedure. Initial ultrasound scanning demonstrates a small to moderate amount of ascites within the right mid hemiabdomen. The right mid hemiabdomen was prepped and draped in the usual sterile fashion. 1% lidocaine with epinephrine was used for local anesthesia. Under direct ultrasound guidance, a 19 gauge, 7-cm, Yueh catheter was introduced. An ultrasound image was saved for documentation purposed. The paracentesis was performed. The catheter was removed and a dressing was applied. The patient tolerated the procedure well without immediate post procedural complication. FINDINGS: A total of approximately 2.6 liters of serous, slightly blood tinged peritoneal fluid was removed. IMPRESSION: Successful ultrasound-guided paracentesis yielding 2.6 liters of peritoneal fluid. Electronically Signed   By: Sandi Mariscal M.D.   On: 02/10/2019 11:20   US Paracentesis  Result Date: 02/06/2019 INDICATION: Hepatocellular carcinoma and reaccumulation of malignant ascites with abdominal distension and pain. EXAM: ULTRASOUND GUIDED PARACENTESIS MEDICATIONS: None. COMPLICATIONS: None immediate. PROCEDURE: Informed written  consent was obtained from the patient after a discussion of the risks, benefits and alternatives to treatment. A timeout was performed prior to the initiation of the procedure. Initial ultrasound was performed to localize ascites. The right lower abdomen was prepped and draped in the usual sterile fashion. 1% lidocaine was used for local anesthesia. Following this, a 6 Fr Safe-T-Centesis catheter was introduced. An ultrasound image was saved for documentation purposes. The paracentesis was performed. The catheter was removed and a dressing was applied. The patient tolerated the procedure well without immediate post procedural complication. FINDINGS: A total of  approximately 2.9 L of blood tinged fluid was removed. IMPRESSION: Successful ultrasound-guided paracentesis yielding 2.9 liters of peritoneal fluid. Electronically Signed   By: Aletta Edouard M.D.   On: 02/06/2019 11:23   US Paracentesis  Result Date: 01/30/2019 INDICATION: Recent diagnosis of cirrhosis and multifocal hepatocellular carcinoma, now with symptomatic intra-abdominal ascites. Please perform ultrasound-guided paracentesis for diagnostic and therapeutic purposes. EXAM: ULTRASOUND-GUIDED PARACENTESIS COMPARISON:  CT abdomen pelvis-01/12/2019; abdominal MRI-01/15/2019 MEDICATIONS: None. COMPLICATIONS: None immediate. TECHNIQUE: Informed written consent was obtained from the patient after a discussion of the risks, benefits and alternatives to treatment. A timeout was performed prior to the initiation of the procedure. Initial ultrasound scanning demonstrates a small to moderate amount of ascites within the right lower abdominal quadrant. The right lower abdomen was prepped and draped in the usual sterile fashion. 1% lidocaine with epinephrine was used for local anesthesia. Under direct ultrasound guidance, a 19 gauge, 7-cm, Yueh catheter was introduced. An ultrasound image was saved for documentation purposed. The paracentesis was performed. The catheter was removed and a dressing was applied. The patient tolerated the procedure well without immediate post procedural complication. FINDINGS: A total of approximately 1.6 liters of serous fluid was removed. Samples were sent to the laboratory as requested by the clinical team. IMPRESSION: Successful ultrasound-guided paracentesis yielding 1.6 liters of peritoneal fluid. Electronically Signed   By: Sandi Mariscal M.D.   On: 01/30/2019 11:50    PERFORMANCE STATUS (ECOG) : 2 - Symptomatic, <50% confined to bed  Review of Systems Unless otherwise noted, a complete review of systems is negative.  Physical Exam General: Frail-appearing Cardiovascular:  regular rate and rhythm Pulmonary: clear ant fields Abdomen: Distended, tympanic, no tenderness to palpation GU: no suprapubic tenderness Extremities: Bilateral lower extremity edema Skin: Crusted lesion to left cheek Neurological: Weakness but otherwise nonfocal  IMPRESSION: Routine follow-up today in the clinic.  I met privately with patient and then had a joint visit with Dr. Rogue Bussing.  Patient's wife participated via phone.  We discussed patient's general goals in the setting of end-stage cancer/cirrhosis.  Patient does not want to continue treatment as it is very likely that he is approaching end-of-life.  Alternatively, we discussed hospice care and both patient and wife were in agreement with focusing on comfort and having hospice follow at home.  Patient has rapidly reaccumulating ascites.  Will pursue Tenckhoff catheter insertion at the earliest opportunity.  PLAN: -Comfort/best supportive care -Continue fentanyl to 72mg Q72H -Continue oxycodone 575mQ6H prn for BTP pain (#60) -Schedule abdominal Pleurx insertion -We will referral to hospice -DNR -RTC as needed   Patient expressed understanding and was in agreement with this plan. He also understands that He can call the clinic at any time with any questions, concerns, or complaints.     Time Total:45 minutes  Visit consisted of counseling and education dealing with the complex and emotionally intense issues of symptom management and palliative  care in the setting of serious and potentially life-threatening illness.Greater than 50%  of this time was spent counseling and coordinating care related to the above assessment and plan.  Signed by: Altha Harm, PhD, NP-C [phone] (Work Cell)

## 2019-02-12 NOTE — Telephone Encounter (Signed)
Per v/o Billey Chang, NP - RN Arranged for u/s para for Monday 8/24 with a 1230 pm with an arrival time for a 1pm procedure. (in medical mall)  pleurx cath placement arranged for next Friday - 02/20/19 - arrival time at 9am for a 10 am procedure time (in medical mall).  Patient and patient's wife was given these instructions on the above apt times. Pt inquired about the formal training he would receive to drain the pleurx cath. He stated that he wants the hospice nurse to demonstrate the drainage procedures to his wife.  He was informed that the nurse in special procedures will provide a demonstration on draining his pleurx cath. Reassurance provided. Explained to patient that he would be provided with drainage starter kit prior to d/c from procedure next week. He thanked me for the above information and remained pleasant in conversation.

## 2019-02-12 NOTE — Progress Notes (Signed)
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     DIAGNOSIS: [place]  STAGE:    IV     ;GOALS: palliative  CURRENT/MOST RECENT THERAPY : Hospice    Cancer, hepatocellular ([place])  01/13/2019 Initial Diagnosis   Cancer, hepatocellular ([place])   01/22/2019 -  Chemotherapy   The patient had bevacizumab (AVASTIN) 1,000 mg in sodium chloride 0.9 % 100 mL chemo infusion, 1,075 mg, Intravenous,  Once, 1 of 6 cycles Administration: 1,000 mg (01/22/2019) atezolizumab (TECENTRIQ) 1,200 mg in sodium chloride 0.9 % 250 mL chemo infusion, 1,200 mg, Intravenous, Once, 1 of 6 cycles Administration: 1,200 mg (01/22/2019)  for chemotherapy treatment.       HISTORY OF PRESENTING ILLNESS:  Jacob Wilcox 63 y.o.  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  Patient received cycle #1 of Tecentriq-Avastin approximately 3 weeks ago.  Patient also noted to have decompensation of his cirrhosis along with the swelling of the legs. Patient had multiple visits to the cancer center since then/symptom management clinic visits- for worsening ascites/needing paracentesis recurrent.    Patient did not keep up his appointment to GI for financial reasons.   Overall he feels poorly.  Poor appetite.  Complains of leg swelling.  Complains  abdominal distention.  Patient in general very upset/rude to the staff.  Patient using expletives.   Review of Systems  Constitutional: Positive for malaise/fatigue and weight loss. Negative for chills, diaphoresis and fever.  HENT: Negative for nosebleeds and sore throat.   Eyes: Negative for double vision.  Respiratory: Negative for cough, hemoptysis, sputum production, shortness of breath and wheezing.   Cardiovascular: Positive for leg swelling. Negative for chest pain, palpitations and orthopnea.  Gastrointestinal: Positive for abdominal pain and nausea. Negative for blood in stool, constipation, heartburn and melena.  Genitourinary: Negative for dysuria, frequency and urgency.  Musculoskeletal: Positive for back pain and joint pain.  Skin: Positive for itching and rash.  Neurological: Negative for dizziness, tingling, focal weakness, weakness and headaches.  Endo/Heme/Allergies: Does not bruise/bleed easily.  Psychiatric/Behavioral: Negative for depression. The patient is nervous/anxious and has insomnia.     Physical exam not done:   MEDICAL HISTORY:  Past Medical History:  Diagnosis Date  . Cancer ([place])    Stage 4 Liver  . Hepatitis C     SURGICAL HISTORY: Past Surgical History:  Procedure Laterality Date  . HERNIA REPAIR     not sure what it was--when he was a baby    SOCIAL HISTORY: Social History   Socioeconomic History  . Marital status: Married    Spouse name: Not on file  . Number of children: Not on file  . Years of education: Not on file  . Highest education level: Not on file  Occupational History  . Not on file  Social Needs  . Financial resource strain: Not on file  . Food  insecurity    Worry: Not on file    Inability: Not on file  . Transportation needs    Medical: Not on file    Non-medical: Not on file  Tobacco Use  . Smoking status: Never Smoker  . Smokeless tobacco: Never Used  Substance and Sexual Activity  . Alcohol use: Yes  .  Drug use: Never  . Sexual activity: Not on file  Lifestyle  . Physical activity    Days per week: Not on file    Minutes per session: Not on file  . Stress: Not on file  Relationships  . Social Herbalist on phone: Not on file    Gets together: Not on file    Attends religious service: Not on file    Active member of club or organization: Not on file    Attends meetings of clubs or organizations: Not on file    Relationship status: Not on file  . Intimate partner violence    Fear of current or ex partner: Not on file    Emotionally abused: Not on file    Physically abused: Not on file    Forced sexual activity: Not on file  Other Topics Concern  . Not on file  Social History Narrative   Lives in [place]; with wife; Engineer, drilling; smoke 1ppd x 30 years; alcohol 6 pack beer/day.     FAMILY HISTORY: History reviewed. No pertinent family history.  ALLERGIES:  is allergic to fentanyl and poison ivy extract.  MEDICATIONS:  Current Outpatient Medications  Medication Sig Dispense Refill  . escitalopram (LEXAPRO) 10 MG tablet Take 0.5 tablets (5 mg total) by mouth daily. 30 tablet 0  . fentaNYL (DURAGESIC) 25 MCG/HR Place 1 patch onto the skin every 3 (three) days. 5 patch 0  . furosemide (LASIX) 40 MG tablet Take 1 tablet (40 mg total) by mouth daily. 30 tablet 2  . polyethylene glycol (MIRALAX) 17 g packet Take 17 g by mouth daily. 14 each 0  . potassium chloride SA (K-DUR) 20 MEQ tablet 1 pill twice a day 30 tablet 3  . prochlorperazine (COMPAZINE) 10 MG tablet Take 1 tablet (10 mg total) by mouth every 6 (six) hours as needed for nausea or vomiting. 40 tablet 1  . senna (SENOKOT) 8.6 MG TABS tablet Take 1 tablet (8.6 mg total) by mouth daily. 120 tablet 0  . sulfamethoxazole-trimethoprim (BACTRIM) 400-80 MG tablet Take 1 tablet by mouth 2 (two) times daily. 20 tablet 0  . vitamin B-12 (CYANOCOBALAMIN) 1000 MCG tablet Take 1,000 mcg by mouth daily.    . Loperamide HCl  (IMODIUM A-D PO) Take by mouth daily as needed.    . naloxone (NARCAN) nasal spray 4 mg/0.1 mL See instrucitons (Patient not taking: Reported on 02/12/2019) 1 each 0  . oxyCODONE (OXY IR/ROXICODONE) 5 MG immediate release tablet Take 1 tablet (5 mg total) by mouth every 6 (six) hours as needed for severe pain. 45 tablet 0   No current facility-administered medications for this visit.    Facility-Administered Medications Ordered in Other Visits  Medication Dose Route Frequency Provider Last Rate Last Dose  . 0.9 %  sodium chloride infusion   Intravenous Continuous Jacquelin Hawking, NP   Stopped at 02/06/19 1427  . morphine 2 MG/ML injection 1 mg  1 mg Intravenous Once Jacquelin Hawking, NP          .  PHYSICAL EXAMINATION: ECOG PERFORMANCE STATUS: 1 - Symptomatic but completely ambulatory  Vitals:   02/12/19 1038  BP: 112/78  Pulse: (!) 123  Resp: (!) 22  Temp: (!) 96.5 F (35.8 C)   There were no vitals filed for this visit.    LABORATORY DATA:  I have reviewed the data as listed Lab Results  Component Value Date   WBC 4.6 02/12/2019   HGB 12.7 (L) 02/12/2019   HCT 36.9 (L) 02/12/2019   MCV 96.1 02/12/2019   PLT 41 (L) 02/12/2019   Recent Labs    02/06/19 1120 02/09/19 1126 02/12/19 1002  NA 128* 132* 132*  K 4.3 4.0 4.2  CL 95* 98 99  CO2 25 28 26   GLUCOSE 99 94 103*  BUN 21 24* 17  CREATININE 0.60* 0.68 0.58*  CALCIUM 8.9 9.2 8.9  GFRNONAA >60 >60 >60  GFRAA >60 >60 >60  PROT 6.7 6.7 7.1  ALBUMIN 2.6* 2.6* 2.6*  AST 308* 268* 192*  ALT 57* 88* 90*  ALKPHOS 173* 230* 256*  BILITOT 1.8* 1.8* 2.8*    RADIOGRAPHIC STUDIES: I have personally reviewed the radiological images as listed and agreed with the findings in the report. Mr Liver W Wo Contrast  Result Date: 01/15/2019 CLINICAL DATA:  63 year old male with liver lesion noted on prior CT examination. History of hepatitis C and cirrhosis. Abdominal pain. Follow-up study. EXAM: MRI ABDOMEN WITHOUT AND  WITH CONTRAST TECHNIQUE: Multiplanar multisequence MR imaging of the abdomen was performed both before and after the administration of intravenous contrast. CONTRAST:  7 mL of Gadavist. COMPARISON:  No prior abdominal MRI.  CT the abdomen and pelvis FINDINGS: Lower chest: Multiple small pulmonary nodules are noted in the visualize lung bases measuring up to 8 mm in the right lower lobe. Hepatobiliary: Liver has a nodular contour, indicative of underlying cirrhosis. There are multiple heterogeneous appearing hepatic lesions scattered throughout the liver, most evident in the right lobe of the liver. The largest of these is centered in segment 8 near the dome (axial image 18 of series 15) measuring 6.6 x 6.1 cm. This lesion is heterogeneous in signal intensity on T1 and T2 weighted images, but generally mildly T1 hyperintense and T2 hypointense with mild heterogeneous arterial phase hyperenhancement and some minimal delayed washout. Several other lesions are noted, including a small lesion in between segments 6 and 7 (axial image 34 of series 15) measuring 3.4 x 2.9 cm which demonstrates isointense to mildly hyperintense T1 signal intensity, T2 hypointensity, and demonstrates arterial phase hyperenhancement with definitive delayed washout. Innumerable other smaller lesions are also noted. The portal vein appears expanded, likely by tumor thrombus. This extends into both the left and right branches of the portal vein. The left branch remains partially patent, while the right branches completely occluded. No intra or extrahepatic biliary ductal dilatation. Gallbladder is unremarkable in appearance. Pancreas: No pancreatic mass. No pancreatic ductal dilatation. Small amount of peripancreatic fluid, without a well-defined organized fluid collection. Spleen:  Unremarkable. Adrenals/Urinary Tract: Bilateral kidneys and adrenal glands are normal in appearance. No hydroureteronephrosis in the visualized portions of the  abdomen. Stomach/Bowel: Visualized portions are unremarkable. Vascular/Lymphatic: Aortic atherosclerosis, without definite aneurysm in the abdominal vasculature. Portal vein thrombus, as detailed above. 9 mm short axis gastrohepatic ligament lymph node (axial image 32 of series 15) which demonstrates some mild diffusion restriction, suspicious for metastatic lymph node. Other:  Moderate volume of ascites. Musculoskeletal: No aggressive appearing osseous lesions are noted in the visualized portions of the skeleton. IMPRESSION: 1. Cirrhosis with multifocal infiltrative neoplasm most evident in  the right lobe of the liver, as detailed above, most likely to reflect multifocal hepatocellular carcinoma. This is associated with probable tumor thrombus in the portal venous system, as detailed above. Widespread metastatic disease noted in the lung bases. Borderline enlarged gastrohepatic ligament lymph node demonstrating diffusion restriction also suspicious for nodal metastasis. 2. Moderate volume of ascites. 3. Additional incidental findings, as above. Electronically Signed   By: Vinnie Langton M.D.   On: 01/15/2019 12:22   US Paracentesis  Result Date: 02/10/2019 INDICATION: History of cirrhosis and multifocal hepatocellular carcinoma with recurrent symptomatic intra-abdominal ascites. Please perform ultrasound-guided paracentesis for therapeutic purposes. EXAM: ULTRASOUND-GUIDED PARACENTESIS COMPARISON:  Multiple previous ultrasound-guided paracenteses, most recently on 02/06/2019 yielding 2.9 L of peritoneal fluid. MEDICATIONS: None. COMPLICATIONS: None immediate. TECHNIQUE: Informed written consent was obtained from the patient after a discussion of the risks, benefits and alternatives to treatment. A timeout was performed prior to the initiation of the procedure. Initial ultrasound scanning demonstrates a small to moderate amount of ascites within the right mid hemiabdomen. The right mid hemiabdomen was prepped  and draped in the usual sterile fashion. 1% lidocaine with epinephrine was used for local anesthesia. Under direct ultrasound guidance, a 19 gauge, 7-cm, Yueh catheter was introduced. An ultrasound image was saved for documentation purposed. The paracentesis was performed. The catheter was removed and a dressing was applied. The patient tolerated the procedure well without immediate post procedural complication. FINDINGS: A total of approximately 2.6 liters of serous, slightly blood tinged peritoneal fluid was removed. IMPRESSION: Successful ultrasound-guided paracentesis yielding 2.6 liters of peritoneal fluid. Electronically Signed   By: Sandi Mariscal M.D.   On: 02/10/2019 11:20   US Paracentesis  Result Date: 02/06/2019 INDICATION: Hepatocellular carcinoma and reaccumulation of malignant ascites with abdominal distension and pain. EXAM: ULTRASOUND GUIDED PARACENTESIS MEDICATIONS: None. COMPLICATIONS: None immediate. PROCEDURE: Informed written consent was obtained from the patient after a discussion of the risks, benefits and alternatives to treatment. A timeout was performed prior to the initiation of the procedure. Initial ultrasound was performed to localize ascites. The right lower abdomen was prepped and draped in the usual sterile fashion. 1% lidocaine was used for local anesthesia. Following this, a 6 Fr Safe-T-Centesis catheter was introduced. An ultrasound image was saved for documentation purposes. The paracentesis was performed. The catheter was removed and a dressing was applied. The patient tolerated the procedure well without immediate post procedural complication. FINDINGS: A total of approximately 2.9 L of blood tinged fluid was removed. IMPRESSION: Successful ultrasound-guided paracentesis yielding 2.9 liters of peritoneal fluid. Electronically Signed   By: Aletta Edouard M.D.   On: 02/06/2019 11:23   US Paracentesis  Result Date: 01/30/2019 INDICATION: Recent diagnosis of cirrhosis and  multifocal hepatocellular carcinoma, now with symptomatic intra-abdominal ascites. Please perform ultrasound-guided paracentesis for diagnostic and therapeutic purposes. EXAM: ULTRASOUND-GUIDED PARACENTESIS COMPARISON:  CT abdomen pelvis-01/12/2019; abdominal MRI-01/15/2019 MEDICATIONS: None. COMPLICATIONS: None immediate. TECHNIQUE: Informed written consent was obtained from the patient after a discussion of the risks, benefits and alternatives to treatment. A timeout was performed prior to the initiation of the procedure. Initial ultrasound scanning demonstrates a small to moderate amount of ascites within the right lower abdominal quadrant. The right lower abdomen was prepped and draped in the usual sterile fashion. 1% lidocaine with epinephrine was used for local anesthesia. Under direct ultrasound guidance, a 19 gauge, 7-cm, Yueh catheter was introduced. An ultrasound image was saved for documentation purposed. The paracentesis was performed. The catheter was removed and a dressing was applied.  The patient tolerated the procedure well without immediate post procedural complication. FINDINGS: A total of approximately 1.6 liters of serous fluid was removed. Samples were sent to the laboratory as requested by the clinical team. IMPRESSION: Successful ultrasound-guided paracentesis yielding 1.6 liters of peritoneal fluid. Electronically Signed   By: Sandi Mariscal M.D.   On: 01/30/2019 11:50    ASSESSMENT & PLAN:   Cancer, hepatocellular ([place]) #Stage IV /hepatocellular cancer-[place]arcelona stage-C/HCC.  Alpha-fetoprotein 3 40,000.  Patient status post 1 cycle of Tecentriq plus Avastin.    #Patient is clinically progressive disease/along with decompensation of the liver.  This is also complicated by patient's poor social support.  #Had a long discussion the patient and his wife [over the phone]-regarding the poor prognosis poor tolerance to therapy given his decompensated cirrhosis.  I think he is at  significantly high risk of treatment late complications from his underlying decompensated cirrhosis.  #Cirrhosis child Pugh C/decompensated-ascites/leg swelling-on Lasix/potassium.  Status post multiple paracentesis.  Discussed regarding Pleurx catheter placement.  #Abdominal pain-continue oxycodone 5 mg every 6 hours as needed for pain.  #Goals of care: Unfortunately prognosis extremely poor life expectancy is in the order of few months [less than 3 months].  I would recommend hospice.  Patient initially reluctant; however agrees that hospice might be helpful in keeping his quality of life steady at this time.  His wife also agrees.  Discussed with [facility].;  Will initiate a hospice referral.  # 40 minutes face-to-face with the patient discussing the above plan of care; more than 50% of time spent on prognosis/ natural history; counseling and coordination.   All questions were answered. The patient knows to call the clinic with any problems, questions or concerns.    Cammie Sickle, MD 02/12/2019 2:20 PM

## 2019-02-12 NOTE — Progress Notes (Signed)
I was informed by reception team that patient came to clinic today expressing rude and fowl language and displaying aggressive behavior. Very upset at this apt times and stated that his "apt time was wrong and the cancer center was lying to me about the appointment times." He was "not late for this apt."  Security was paged due to patient's behavior.  After pt's labs were drawn, patient escorted to neg. Pressure room in cancer center for nursing/md assessment. Patient personally apologized for his negative behavior and choice words to staff in the clinic. Rn discussed expectations of patient behavior and that his aggressive behavior and profanity is not acceptable. Explained to pt that we are here to help him and not harm him. Out of concern this morning for his health, I personally contacted him as he was late for the scheduled apt time. He still insisted that he was not late for this apt today and that his appointment was scheduled at 1030. Pt stated, "I agree to disagree and move on." pt remained calm during the nursing assessment and did not demonstrate any further irrational or agressive behavior.   In clinical assessment, pt has 4+ pitting edema in bil. Lower extremity. Patient reports normal bm pattern. Last bm this morning. He denies any nausea/vomiting/shortness of breath.

## 2019-02-13 ENCOUNTER — Telehealth: Payer: Self-pay | Admitting: *Deleted

## 2019-02-13 NOTE — Telephone Encounter (Signed)
Patient has been referred to hospice and is supposed to have a pleur ex cath inserted on the 28 th. They are asking if his referral can wait until after his cath insertion. Please advise

## 2019-02-13 NOTE — Telephone Encounter (Signed)
Spoke with Dr. Rogue Bussing. He would like hospice to go ahead and make a site visit to the patient if possible. If they are unable to do this, no later than the Monday 8/31. Patient is complex in case due to his medical complaints/concerns. He will require reinforcement education s/p cath placement and assistance with supplies s/p placement.

## 2019-02-13 NOTE — Telephone Encounter (Signed)
Call returned to Neoma Laming and I left a message on voice mail to please do site visit before cath placement no later than 8/31 if not before

## 2019-02-16 ENCOUNTER — Other Ambulatory Visit: Payer: Self-pay

## 2019-02-16 ENCOUNTER — Ambulatory Visit
Admission: RE | Admit: 2019-02-16 | Discharge: 2019-02-16 | Disposition: A | Discharge status: Home / Self Care | Payer: Medicaid Other | Source: Ambulatory Visit | Attending: Hospice and Palliative Medicine | Admitting: Hospice and Palliative Medicine

## 2019-02-16 ENCOUNTER — Telehealth: Payer: Self-pay | Admitting: *Deleted

## 2019-02-16 DIAGNOSIS — C220 Liver cell carcinoma: Secondary | ICD-10-CM | POA: Diagnosis present

## 2019-02-16 DIAGNOSIS — R180 Malignant ascites: Secondary | ICD-10-CM | POA: Diagnosis present

## 2019-02-16 NOTE — Procedures (Signed)
Interventional Radiology Procedure:   Indications: HCC and ascites  Procedure: US guided paracentesis  Findings: Removed 3000 ml from RLQ quadrant  Complications: None     EBL: Less than 10 ml   Francyne Arreaga R. Anselm Pancoast, MD  Pager: [phone]

## 2019-02-16 NOTE — Telephone Encounter (Signed)
Thank you. I spoke with hospice medical director, patient, and wife on Friday.

## 2019-02-16 NOTE — Telephone Encounter (Signed)
Hospice called reporting that patient was evaluated fofr services Friday, but the medical director said he cannot be opened to services until afte rhe has his 2 procedures done, so they will open him on 8/29

## 2019-02-17 ENCOUNTER — Other Ambulatory Visit: Admission: RE | Admit: 2019-02-17 | Payer: Self-pay | Source: Ambulatory Visit

## 2019-02-18 ENCOUNTER — Other Ambulatory Visit: Payer: Self-pay | Admitting: Hospice and Palliative Medicine

## 2019-02-18 MED ORDER — OXYCODONE HCL 5 MG PO TABS: 5.0000 mg | ORAL_TABLET | ORAL | 0 refills | Status: DC | PRN

## 2019-02-18 NOTE — Progress Notes (Signed)
After speaking with Mr Jacob Wilcox today over phone regarding pleurx catheter placement, consulted with Jacob Wilcox. Patient appearing very angry over phone with Hospice team and nurses, regarding his medications. I told Jacob Wilcox that I discussed with patient his procedure and answered questions regarding pleurx cath placement on 02/20/2019. He stated he would allow team to care for tube post procedure at home. Jacob stated he will touch base with patient regarding his care at home with Hospice team.

## 2019-02-18 NOTE — Progress Notes (Signed)
I called and spoke with patient by phone. He is doing reasonably well without significant changes. He continues to require oxycodone to relieve pain. He is taking oxycodone at times more frequently than Q6H. Will change rx to reflect Q4H dosing.   Spoke with IR and pleurx insertion is planned for Friday.   Spoke with hospice and plan is for admission on Saturday.

## 2019-02-19 ENCOUNTER — Telehealth: Payer: Self-pay | Admitting: *Deleted

## 2019-02-19 ENCOUNTER — Other Ambulatory Visit: Payer: Self-pay | Admitting: Radiology

## 2019-02-19 ENCOUNTER — Other Ambulatory Visit: Payer: Self-pay | Admitting: Hospice and Palliative Medicine

## 2019-02-19 MED ORDER — OXYCODONE HCL 5 MG PO TABS: 5.0000 mg | ORAL_TABLET | ORAL | 0 refills | Status: DC | PRN

## 2019-02-19 NOTE — Telephone Encounter (Signed)
I spoke with patient and wife by phone. Questions answered. Patient pending pleurx insertion tomorrow. Hospice will admit to services on Saturday.

## 2019-02-19 NOTE — Telephone Encounter (Signed)
Patient's wife contacted Nira Conn, Rn. Wife reports that paracentesis site from 8/24 is still draining clear fluid from site. She states that there is no signs of infection, but the drainage is saturating the patient's shirts.  Wife placed a standard Band-Aid on the site, but this site is still dripping fluid. Education provided to keep site clean and dry. Advised wife to place a 4x4 dressing with paper tape or tegaderm on top of dressing site to apply some pressure to the site. Wife states that she will ask the local pharmacy to deliver these to her today if possible. Patient is scheduled for a pleurx cath placement tomorrow. Per wife, hospice nurse will come back out on Saturday to officially enrol the patient into hospice services and evaluate and reinforce pleurx cath education. Wife stated that she would be the trainable caregiver for this procedure and asked for the link to watch a video for the procedure prior to the hospice nurse coming out to the home. I directed her to the [facility] patient education website. She was able to find this on her phone and marked the video for her review at later time today or tomorrow. She thanked me for the information.

## 2019-02-20 ENCOUNTER — Ambulatory Visit
Admission: RE | Admit: 2019-02-20 | Discharge: 2019-02-20 | Disposition: A | Discharge status: Home / Self Care | Payer: Medicaid Other | Source: Ambulatory Visit | Attending: Hospice and Palliative Medicine | Admitting: Hospice and Palliative Medicine

## 2019-02-20 ENCOUNTER — Other Ambulatory Visit: Payer: Self-pay

## 2019-02-20 DIAGNOSIS — B1920 Unspecified viral hepatitis C without hepatic coma: Secondary | ICD-10-CM | POA: Diagnosis not present

## 2019-02-20 DIAGNOSIS — R180 Malignant ascites: Secondary | ICD-10-CM | POA: Insufficient documentation

## 2019-02-20 DIAGNOSIS — Z79899 Other long term (current) drug therapy: Secondary | ICD-10-CM | POA: Diagnosis not present

## 2019-02-20 DIAGNOSIS — C220 Liver cell carcinoma: Secondary | ICD-10-CM

## 2019-02-20 HISTORY — PX: IR GUIDED DRAIN W CATHETER PLACEMENT: IMG719

## 2019-02-20 LAB — PROTIME-INR
INR: 1.1 (ref 0.8–1.2)
Prothrombin Time: 14 seconds (ref 11.4–15.2)

## 2019-02-20 LAB — BASIC METABOLIC PANEL
Anion gap: 7 (ref 5–15)
BUN: 15 mg/dL (ref 8–23)
CO2: 30 mmol/L (ref 22–32)
Calcium: 9 mg/dL (ref 8.9–10.3)
Chloride: 97 mmol/L — ABNORMAL LOW (ref 98–111)
Creatinine, Ser: 0.58 mg/dL — ABNORMAL LOW (ref 0.61–1.24)
GFR calc Af Amer: >60 mL/min (ref >60)
GFR calc non Af Amer: >60 mL/min (ref >60)
Glucose, Bld: 108 mg/dL — ABNORMAL HIGH (ref 70–99)
Potassium: 3.8 mmol/L (ref 3.5–5.1)
Sodium: 134 mmol/L — ABNORMAL LOW (ref 135–145)

## 2019-02-20 LAB — CBC
HCT: 37.1 % — ABNORMAL LOW (ref 39.0–52.0)
Hemoglobin: 12.8 g/dL — ABNORMAL LOW (ref 13.0–17.0)
MCH: 33.4 pg (ref 26.0–34.0)
MCHC: 34.5 g/dL (ref 30.0–36.0)
MCV: 96.9 fL (ref 80.0–100.0)
Platelets: 53 10*3/uL — ABNORMAL LOW (ref 150–400)
RBC: 3.83 MIL/uL — ABNORMAL LOW (ref 4.22–5.81)
RDW: 15.7 % — ABNORMAL HIGH (ref 11.5–15.5)
WBC: 4.2 10*3/uL (ref 4.0–10.5)
nRBC: 0 % (ref 0.0–0.2)

## 2019-02-20 MED ORDER — CEFAZOLIN SODIUM-DEXTROSE 2-4 GM/100ML-% IV SOLN
INTRAVENOUS | Status: AC
  Administered 2019-02-20: 2 g
  Filled 2019-02-20: qty 100

## 2019-02-20 MED ORDER — MIDAZOLAM HCL 2 MG/2ML IJ SOLN
INTRAMUSCULAR | Status: AC | PRN
  Administered 2019-02-20 (×2): 1 mg via INTRAVENOUS

## 2019-02-20 MED ORDER — FENTANYL CITRATE (PF) 100 MCG/2ML IJ SOLN
INTRAMUSCULAR | Status: AC
  Filled 2019-02-20: qty 4

## 2019-02-20 MED ORDER — MIDAZOLAM HCL 2 MG/2ML IJ SOLN
INTRAMUSCULAR | Status: AC
  Filled 2019-02-20: qty 4

## 2019-02-20 MED ORDER — CEFAZOLIN SODIUM-DEXTROSE 2-4 GM/100ML-% IV SOLN: 2.0000 g | INTRAVENOUS | Status: DC

## 2019-02-20 MED ORDER — FENTANYL CITRATE (PF) 100 MCG/2ML IJ SOLN
INTRAMUSCULAR | Status: AC | PRN
  Administered 2019-02-20 (×2): 50 ug via INTRAVENOUS

## 2019-02-20 MED ORDER — SODIUM CHLORIDE 0.9 % IV SOLN
INTRAVENOUS | Status: DC
  Administered 2019-02-20: 10:00:00 via INTRAVENOUS

## 2019-02-20 NOTE — Discharge Instructions (Signed)
Ascites Drainage Catheter Home Guide An ascites drainage catheter is a thin, flexible tube that helps drain excess fluid that has collected in the abdomen (ascites). Draining the fluid can help prevent pain and keep other problems from developing. There are several kinds of ascites drainage systems. Follow general guidelines as well as instructions about your specific drainage system. Your health care provider will instruct you on:  How to use your system.  How much fluid to drain.  How often to drain the fluid. Call your health care provider if you have any questions or concerns. What are the risks? The ascites drainage catheter system is generally safe. However, problems may occur, including:  Infection.  Bleeding.  A dislodged or blocked catheter. Supplies needed:  Soap and warm water.  A mask.  Alcohol wipes.  Gauze.  Paper tape.  Drainage container.  Alcohol-based cleaner or bleach-based cleaner. How to use the catheter to drain fluid  1. Clean any surface that you will be using. 2. Wash your hands with soap and warm water. If your hands are not visibly dirty, you can use an alcohol-based skin cleanser instead. 3. Put on a mask. Make sure that it covers your nose and mouth. 4. Place all the supplies you will need on the surfaces that you have cleaned. Make sure that you can easily reach them. 5. Clean the safety valve on the end of the drainage catheter with an alcohol wipe. 6. Attach the adapter or access tip to the safety valve on your catheter. To do this, you may need to remove a protective cap on the safety valve. 7. Release the clamps on your drainage container. 8. Allow the fluid to drain into the container. 9. Close the clamps and release the catheter from the drainage container. 10. Write down the amount of fluid that was drained, if your health care provider has asked you to do that. 11. Follow instructions from your health care provider about  collecting and transporting the fluid. Your health care provider may ask you to collect the fluid and take it to the lab to be analyzed. If you are told to dispose of the fluid, pour it down the toilet or do as told by your health care provider. 12. Clean the surfaces where any fluid was spilled with an alcohol-based or bleach-based cleaner. What should I do after I drain the fluid? 1. Wipe the end of your catheter with an alcohol wipe. 2. Put the protective cap back onto the safety valve. 3. Follow instructions from your health care provider and: ? Place gauze bandages (dressings) around the catheter area. ? Secure the catheter to your body with gauze and tape. Contact a health care provider if you have:  Skin irritation, redness, or pain where the catheter was inserted (insertion site).  Trouble draining fluid.  Trouble caring for your catheter.  Abdominal pain, bloating, or cramping. Get help right away if you:  Develop a fever or chills.  Have increasing redness or increasing pain around the insertion site.  Have blood or pus coming from the insertion site.  Notice that the fluid that drains out of the catheter becomes cloudy or has a bad smell.  Have abdominal pain or cramping that worsens or does not go away. Summary  An ascites drainage catheter is a thin, flexible tube that helps drain excess fluid that has collected in your abdomen.  There are several kinds of ascites drainage systems. Follow general guidelines as well as your health care  provider's instructions about your specific drainage system.  Follow instructions from your health care provider about collecting and transporting the fluid. Your health care provider may ask you to collect the fluid and take it to the lab to be analyzed. This information is not intended to replace advice given to you by your health care provider. Make sure you discuss any questions you have with your health care provider. Document  Released: 05/31/2011 Document Revised: 05/13/2018 Document Reviewed: 05/01/2017 Elsevier Patient Education  [place]. Moderate Conscious Sedation, Adult, Care After These instructions provide you with information about caring for yourself after your procedure. Your health care provider may also give you more specific instructions. Your treatment has been planned according to current medical practices, but problems sometimes occur. Call your health care provider if you have any problems or questions after your procedure. What can I expect after the procedure? After your procedure, it is common:  To feel sleepy for several hours.  To feel clumsy and have poor balance for several hours.  To have poor judgment for several hours.  To vomit if you eat too soon. Follow these instructions at home: For at least 24 hours after the procedure:   Do not: ? Participate in activities where you could fall or become injured. ? Drive. ? Use heavy machinery. ? Drink alcohol. ? Take sleeping pills or medicines that cause drowsiness. ? Make important decisions or sign legal documents. ? Take care of children on your own.  Rest. Eating and drinking  Follow the diet recommended by your health care provider.  If you vomit: ? Drink water, juice, or soup when you can drink without vomiting. ? Make sure you have little or no nausea before eating solid foods. General instructions  Have a responsible adult stay with you until you are awake and alert.  Take over-the-counter and prescription medicines only as told by your health care provider.  If you smoke, do not smoke without supervision.  Keep all follow-up visits as told by your health care provider. This is important. Contact a health care provider if:  You keep feeling nauseous or you keep vomiting.  You feel light-headed.  You develop a rash.  You have a fever. Get help right away if:  You have trouble breathing. This  information is not intended to replace advice given to you by your health care provider. Make sure you discuss any questions you have with your health care provider. Document Released: 04/01/2013 Document Revised: 05/24/2017 Document Reviewed: 10/01/2015 Elsevier Patient Education  2020 [employer].

## 2019-02-20 NOTE — OR Nursing (Signed)
After pt left hospital he called back and said his abdomen was leaking. He was in car and couldn't look at site. Dr Reesa Chew informed. Pt returned to hospital and taken to [place] procedure room and dressing of new pleurex tube site removed. No evidence of wet guaze. Noted that puncture site from previous paracentesis was draining clear fluid. Dr Annamaria Boots notified but to due his being in another procedure he instructed me to drain one bottle of fluid and put Dermabond on prior puncture site and redress both. Removed 1 liter of fluid and no active drainage of either site noted. Pt discharged again.

## 2019-02-20 NOTE — Procedures (Signed)
Hep c, cirrhosis, malignany ascites  S/p [place] and fluoro abdominal Pleurx  850cc removed No comp Stable ebl min Full report in pacs

## 2019-02-20 NOTE — H&P (Signed)
Chief Complaint:  Malignant ascites  Referring Physician(s): Borders,Joshua R   History of Present Illness: Jacob Wilcox is a 63 y.o. male with Hep C, cirrhosis, and HCC.  He was maliginany ascites requiring frequent paras. Plan for abdominal pleurx today.  No complaints or recent illness. ROS neg except for abdominal distention.  Past Medical History:  Diagnosis Date   Cancer ([place])    Stage 4 Liver   Hepatitis C     Past Surgical History:  Procedure Laterality Date   HERNIA REPAIR     not sure what it was--when he was a baby    Allergies: Fentanyl and Poison ivy extract  Medications: Prior to Admission medications   Medication Sig Start Date End Date Taking? Authorizing Provider  escitalopram (LEXAPRO) 10 MG tablet Take 0.5 tablets (5 mg total) by mouth daily. 02/03/19  Yes Borders, Kirt Boys, NP  oxyCODONE (OXY IR/ROXICODONE) 5 MG immediate release tablet Take 1 tablet (5 mg total) by mouth every 4 (four) hours as needed for severe pain. 02/20/19  Yes Borders, Kirt Boys, NP  fentaNYL (DURAGESIC) 25 MCG/HR Place 1 patch onto the skin every 3 (three) days. 02/03/19   Borders, Kirt Boys, NP  furosemide (LASIX) 40 MG tablet Take 1 tablet (40 mg total) by mouth daily. 01/22/19   Cammie Sickle, MD  Loperamide HCl (IMODIUM A-D PO) Take by mouth daily as needed.    [provider]  naloxone [facility]) nasal spray 4 mg/0.1 mL See instrucitons Patient not taking: Reported on 02/12/2019 01/29/19   Verlon Au, NP  polyethylene glycol (MIRALAX) 17 g packet Take 17 g by mouth daily. Patient not taking: Reported on 02/20/2019 02/09/19   Jacquelin Hawking, NP  potassium chloride SA (K-DUR) 20 MEQ tablet 1 pill twice a day Patient not taking: Reported on 02/20/2019 01/22/19   Cammie Sickle, MD  prochlorperazine (COMPAZINE) 10 MG tablet Take 1 tablet (10 mg total) by mouth every 6 (six) hours as needed for nausea or vomiting. Patient not taking: Reported on  02/20/2019 01/22/19   Cammie Sickle, MD  senna (SENOKOT) 8.6 MG TABS tablet Take 1 tablet (8.6 mg total) by mouth daily. Patient not taking: Reported on 02/20/2019 02/09/19   Jacquelin Hawking, NP  sulfamethoxazole-trimethoprim (BACTRIM) 400-80 MG tablet Take 1 tablet by mouth 2 (two) times daily. Patient not taking: Reported on 02/20/2019 02/03/19   Borders, Kirt Boys, NP  vitamin B-12 (CYANOCOBALAMIN) 1000 MCG tablet Take 1,000 mcg by mouth daily.    [provider]     History reviewed. No pertinent family history.  Social History   Socioeconomic History   Marital status: Married    Spouse name: [name]    Number of children: Not on file   Years of education: Not on file   Highest education level: Not on file  Occupational History   Not on file  Social Needs   Financial resource strain: Not very hard   Food insecurity    Worry: Never true    Inability: Never true   Transportation needs    Medical: No    Non-medical: No  Tobacco Use   Smoking status: Never Smoker   Smokeless tobacco: Never Used  Substance and Sexual Activity   Alcohol use: Yes   Drug use: Never   Sexual activity: Not on file  Lifestyle   Physical activity    Days per week: Not on file    Minutes per session:  Not on file   Stress: To some extent  Relationships   Social connections    Talks on phone: More than three times a week    Gets together: Not on file    Attends religious service: Not on file    Active member of club or organization: Not on file    Attends meetings of clubs or organizations: Not on file    Relationship status: Not on file  Other Topics Concern   Not on file  Social History Narrative   Lives in [place]; with wife; Engineer, drilling; smoke 1ppd x 30 years; alcohol 6 pack beer/day.     ECOG Status: 2 - Symptomatic, <50% confined to bed  Review of Systems: A 12 point ROS discussed and pertinent positives are indicated in the HPI above.  All other systems are  negative.  Review of Systems  Vital Signs: Pulse 97    Temp 97.7 F (36.5 C) (Oral)    Resp 20    Ht 5\' 10"  (1.778 m)    Wt 71.7 kg    BMI 22.67 kg/m   Physical Exam Constitutional:      General: He is not in acute distress.    Appearance: He is not toxic-appearing or diaphoretic.  Eyes:     General: No scleral icterus.    Conjunctiva/sclera: Conjunctivae normal.  Cardiovascular:     Rate and Rhythm: Normal rate and regular rhythm.     Heart sounds: No murmur.  Pulmonary:     Effort: Pulmonary effort is normal.     Breath sounds: Wheezing present.  Abdominal:     General: There is distension.     Tenderness: There is no abdominal tenderness. There is no guarding.  Neurological:     General: No focal deficit present.  Psychiatric:        Mood and Affect: Mood normal.     Imaging: US Paracentesis  Result Date: 02/16/2019 INDICATION: 63 year old with hepatocellular carcinoma and ascites. EXAM: ULTRASOUND GUIDED PARACENTESIS MEDICATIONS: None. COMPLICATIONS: None immediate. PROCEDURE: Informed written consent was obtained from the patient after a discussion of the risks, benefits and alternatives to treatment. A timeout was performed prior to the initiation of the procedure. Initial ultrasound scanning demonstrates a large amount of ascites within the right lower abdominal quadrant. The right lower abdomen was prepped and draped in the usual sterile fashion. 1% lidocaine was used for local anesthesia. Following this, a 6 Fr Safe-T-Centesis catheter was introduced. An ultrasound image was saved for documentation purposes. The paracentesis was performed. The catheter was removed and a dressing was applied. The patient tolerated the procedure well without immediate post procedural complication. FINDINGS: A total of approximately 3 L of amber fluid was removed. IMPRESSION: Successful ultrasound-guided paracentesis yielding 3 liters of peritoneal fluid. Electronically Signed   By: Markus Daft M.D.   On: 02/16/2019 14:56   US Paracentesis  Result Date: 02/10/2019 INDICATION: History of cirrhosis and multifocal hepatocellular carcinoma with recurrent symptomatic intra-abdominal ascites. Please perform ultrasound-guided paracentesis for therapeutic purposes. EXAM: ULTRASOUND-GUIDED PARACENTESIS COMPARISON:  Multiple previous ultrasound-guided paracenteses, most recently on 02/06/2019 yielding 2.9 L of peritoneal fluid. MEDICATIONS: None. COMPLICATIONS: None immediate. TECHNIQUE: Informed written consent was obtained from the patient after a discussion of the risks, benefits and alternatives to treatment. A timeout was performed prior to the initiation of the procedure. Initial ultrasound scanning demonstrates a small to moderate amount of ascites within the right mid hemiabdomen. The right mid hemiabdomen was prepped and draped in the  usual sterile fashion. 1% lidocaine with epinephrine was used for local anesthesia. Under direct ultrasound guidance, a 19 gauge, 7-cm, Yueh catheter was introduced. An ultrasound image was saved for documentation purposed. The paracentesis was performed. The catheter was removed and a dressing was applied. The patient tolerated the procedure well without immediate post procedural complication. FINDINGS: A total of approximately 2.6 liters of serous, slightly blood tinged peritoneal fluid was removed. IMPRESSION: Successful ultrasound-guided paracentesis yielding 2.6 liters of peritoneal fluid. Electronically Signed   By: Sandi Mariscal M.D.   On: 02/10/2019 11:20   US Paracentesis  Result Date: 02/06/2019 INDICATION: Hepatocellular carcinoma and reaccumulation of malignant ascites with abdominal distension and pain. EXAM: ULTRASOUND GUIDED PARACENTESIS MEDICATIONS: None. COMPLICATIONS: None immediate. PROCEDURE: Informed written consent was obtained from the patient after a discussion of the risks, benefits and alternatives to treatment. A timeout was performed  prior to the initiation of the procedure. Initial ultrasound was performed to localize ascites. The right lower abdomen was prepped and draped in the usual sterile fashion. 1% lidocaine was used for local anesthesia. Following this, a 6 Fr Safe-T-Centesis catheter was introduced. An ultrasound image was saved for documentation purposes. The paracentesis was performed. The catheter was removed and a dressing was applied. The patient tolerated the procedure well without immediate post procedural complication. FINDINGS: A total of approximately 2.9 L of blood tinged fluid was removed. IMPRESSION: Successful ultrasound-guided paracentesis yielding 2.9 liters of peritoneal fluid. Electronically Signed   By: Aletta Edouard M.D.   On: 02/06/2019 11:23   US Paracentesis  Result Date: 01/30/2019 INDICATION: Recent diagnosis of cirrhosis and multifocal hepatocellular carcinoma, now with symptomatic intra-abdominal ascites. Please perform ultrasound-guided paracentesis for diagnostic and therapeutic purposes. EXAM: ULTRASOUND-GUIDED PARACENTESIS COMPARISON:  CT abdomen pelvis-01/12/2019; abdominal MRI-01/15/2019 MEDICATIONS: None. COMPLICATIONS: None immediate. TECHNIQUE: Informed written consent was obtained from the patient after a discussion of the risks, benefits and alternatives to treatment. A timeout was performed prior to the initiation of the procedure. Initial ultrasound scanning demonstrates a small to moderate amount of ascites within the right lower abdominal quadrant. The right lower abdomen was prepped and draped in the usual sterile fashion. 1% lidocaine with epinephrine was used for local anesthesia. Under direct ultrasound guidance, a 19 gauge, 7-cm, Yueh catheter was introduced. An ultrasound image was saved for documentation purposed. The paracentesis was performed. The catheter was removed and a dressing was applied. The patient tolerated the procedure well without immediate post procedural  complication. FINDINGS: A total of approximately 1.6 liters of serous fluid was removed. Samples were sent to the laboratory as requested by the clinical team. IMPRESSION: Successful ultrasound-guided paracentesis yielding 1.6 liters of peritoneal fluid. Electronically Signed   By: Sandi Mariscal M.D.   On: 01/30/2019 11:50    Labs:  CBC: Recent Labs    02/03/19 1023 02/06/19 1120 02/09/19 1126 02/12/19 1002  WBC 3.0* 2.8* 4.3 4.6  HGB 12.1* 12.3* 13.1 12.7*  HCT 34.8* 34.3* 36.9* 36.9*  PLT 53* 36* 36* 41*    COAGS: Recent Labs    01/13/19 1628  INR 1.1  APTT 28    BMP: Recent Labs    02/03/19 1023 02/06/19 1120 02/09/19 1126 02/12/19 1002  NA 130* 128* 132* 132*  K 3.6 4.3 4.0 4.2  CL 96* 95* 98 99  CO2 25 25 28 26   GLUCOSE 116* 99 94 103*  BUN 17 21 24* 17  CALCIUM 8.7* 8.9 9.2 8.9  CREATININE 0.67 0.60* 0.68 0.58*  GFRNONAA >60 >  60 >60 >60  GFRAA >60 >60 >60 >60    LIVER FUNCTION TESTS: Recent Labs    02/03/19 1023 02/06/19 1120 02/09/19 1126 02/12/19 1002  BILITOT 1.8* 1.8* 1.8* 2.8*  AST 193* 308* 268* 192*  ALT 53* 57* 88* 90*  ALKPHOS 180* 173* 230* 256*  PROT 6.8 6.7 6.7 7.1  ALBUMIN 2.5* 2.6* 2.6* 2.6*    TUMOR MARKERS: No results for input(s): AFPTM, CEA, CA199, CHROMGRNA in the last 8760 hours.  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  Plan for abdominal pleurx.  Risks and benefits discussed with the patient including bleeding, infection, damage to adjacent structures, bowel perforation/fistula connection, and sepsis.  All of the patient's questions were answered, patient is agreeable to proceed. Consent signed and in chart.    Thank you for this interesting consult.  I greatly enjoyed meeting Jacob Wilcox and look forward to participating in their care.  A copy of this report was sent to the requesting provider on this date.  Electronically Signed: Greggory Keen, MD 02/20/2019, 9:29  AM   I spent a total of  30 Minutes   in face to face in clinical consultation, greater than 50% of which was counseling/coordinating care for this patient with malignant ascites.

## 2019-02-22 ENCOUNTER — Telehealth: Payer: Self-pay | Admitting: Oncology

## 2019-02-22 NOTE — Telephone Encounter (Signed)
Wife called last night reporting that patient has pain around his catheter area and also some oozing blood at the site of the catheter site. Also reports that patient has increased swelling of his penis.  Per wife, there is no dripping blood, just some oozing. 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  I explained to wife that these symptoms are due to his cancer progression and also multiple medical problems, and advise patient to take his pain medication and monitor the site of catheter insertion. Wife was very concerned and preferred patient to go to ER for evaluation.. I think it is reasonable to do so. She appreciated the explanation and advise.   Today patient called and reported "my penis is ten times the normal size" and also reports pain.  He is on Oxycodone 5mg  Q4 hours PRN and fentanyl patch 22mcg/h.He feels oxycodone 5mg  helps but does not last long.   I advise patient to take 10mg  Oxycodone Q4 hours as needed and see if that will help his pain. Patient says" is that all you can do? Are you a doctor?"  I asked patient if he went to ER last night, he says" going to ER takes thousands of dollars, do you have the money?" and he hung up.

## 2019-02-23 ENCOUNTER — Telehealth: Payer: Self-pay | Admitting: Hospice and Palliative Medicine

## 2019-02-23 ENCOUNTER — Other Ambulatory Visit: Payer: Self-pay | Admitting: Hospice and Palliative Medicine

## 2019-02-23 ENCOUNTER — Other Ambulatory Visit: Payer: Self-pay

## 2019-02-23 ENCOUNTER — Telehealth: Payer: Self-pay | Admitting: *Deleted

## 2019-02-23 ENCOUNTER — Other Ambulatory Visit: Payer: Medicaid Other | Admitting: Nurse Practitioner

## 2019-02-23 MED ORDER — FENTANYL 25 MCG/HR TD PT72: 1.0000 | MEDICATED_PATCH | TRANSDERMAL | 0 refills | Status: DC

## 2019-02-23 MED ORDER — OXYCODONE HCL 5 MG PO TABS: 5.0000 mg | ORAL_TABLET | ORAL | 0 refills | Status: DC | PRN

## 2019-02-23 NOTE — Telephone Encounter (Signed)
Katharine Look with hospice called asking for orders for draining patient Pleuex. Please return her call [phone]

## 2019-02-23 NOTE — Telephone Encounter (Signed)
I spoke with patient. He reports that the swelling and pain are better. He had no pressing concerns today. He will have hospice come today to evaluate and drain his Pleurx.

## 2019-02-23 NOTE — Telephone Encounter (Signed)
I spoke with hospice nurse. Orders given.

## 2019-02-23 NOTE — Telephone Encounter (Signed)
Max order to drain pleurx is 2,000 ml in one day/24 hours.  In general, the recommendations are that patient could drain every 3 days or as daily as needed.  I contacted the hospice nurse. The pleurx cath is not draining. Only a limited amount was pulled off before patient experienced abd. Cramping. The procedure was aborted and the hospice nurse will try accessing the pleurx on Thursday this week.

## 2019-02-23 NOTE — Telephone Encounter (Signed)
I spoke with patient's hospice nurse. Patient's primary concern is severe scrotal edema and pain. He is having some difficulty voiding. Nurse to examine penis/scrotum to ensure that there is no redness/warmth or s/o infection. Would be okay with placement of a Foley if needed. Would also be okay with increasing the dose of the Lasix to 80mg  x2 days to see if that helps. We discussed other conservative measures including scrotal sling.   Orders given for Pleurx to drain every 3 days until discomfort/dry or max of 5L.   Case and plan discussed with Dr. Rogue Bussing.

## 2019-02-23 NOTE — Telephone Encounter (Signed)
I tried calling patient but did not reach him. I called and spoke with hospice. They were scheduled to initiate services on Saturday (8/29) but patient/family asked that be delayed until today. Patient is now scheduled for hospice visit at [facility] today.

## 2019-02-24 MED ORDER — FENTANYL 25 MCG/HR TD PT72: 1.0000 | MEDICATED_PATCH | TRANSDERMAL | 0 refills | Status: AC

## 2019-02-24 MED ORDER — OXYCODONE HCL 10 MG PO TABS: 10.0000 mg | ORAL_TABLET | ORAL | 0 refills | Status: DC | PRN

## 2019-02-24 NOTE — Addendum Note (Signed)
Addended by: Irean Hong on: 02/24/2019 04:18 PM   Modules accepted: Orders

## 2019-02-25 ENCOUNTER — Telehealth: Payer: Self-pay | Admitting: Hospice and Palliative Medicine

## 2019-02-25 ENCOUNTER — Telehealth: Payer: Self-pay | Admitting: Oncology

## 2019-02-25 NOTE — Telephone Encounter (Signed)
I received a call from April, patient hospice nurse [phone]).  She was unable to successfully draw off any ascitic fluid from the Pleurx today.  Only 20 mL was drawn before patient started having cramping and the procedure was stopped.  I requested that hospice try again on Saturday and if they are unsuccessful than likely will re-refer him to IR early next week.  Although it is possible that patient has not had reaccumulation yet.  I called and spoke with patient.  He felt that he was doing reasonably well.  He says the pain is well controlled and symptoms are generally well managed.  He says fluid decreased in his lower extremities with increased dose of Lasix and has not yet returned.

## 2019-02-25 NOTE — Telephone Encounter (Signed)
Wife called with concern that patient's catheter has not been able to be drained for the entire week. There is a note today when hospice nurse April called, she was only able to drain 20 cc out and the patient has cramping and procedure was stopped. Wife is very concerned that patient's abdomen is more distended and swollen.  Wife is concerned that the catheter is clogged.   Per wife patient is more uncomfortable.  Also wife reports swelling of patient's scrotal area is also getting worse and making him uncomfortable.  Advised patient to continue take pain medication.  If his abdomen pain is unbearable, he should go to emergency room for emergent paracentesis.  Otherwise advised patient to call office during daytime and arrange another paracentesis attempt.  Wife appreciates explanation and agrees with the plan.

## 2019-02-26 ENCOUNTER — Telehealth: Payer: Self-pay | Admitting: Hospice and Palliative Medicine

## 2019-02-26 NOTE — Telephone Encounter (Signed)
I called and spoke with patient/wife. Patient took an extra dose of Lasix overnight and says that the abdominal distension and LE edema are both markedly improved. He has had some dysuria. I called and spoke with the hospice nurse. Orders given for UA. I asked that they call and check on him over the weekend with plan to drain pleurx if needed.

## 2019-02-27 ENCOUNTER — Other Ambulatory Visit: Payer: Self-pay | Admitting: Hospice and Palliative Medicine

## 2019-02-27 MED ORDER — FUROSEMIDE 40 MG PO TABS: 40.0000 mg | ORAL_TABLET | Freq: Every day | ORAL | 2 refills | Status: AC

## 2019-03-03 ENCOUNTER — Other Ambulatory Visit: Payer: Self-pay | Admitting: Internal Medicine

## 2019-03-03 ENCOUNTER — Other Ambulatory Visit: Payer: Self-pay | Admitting: *Deleted

## 2019-03-03 ENCOUNTER — Telehealth: Payer: Self-pay | Admitting: *Deleted

## 2019-03-03 ENCOUNTER — Other Ambulatory Visit: Payer: Self-pay

## 2019-03-03 ENCOUNTER — Other Ambulatory Visit: Payer: Self-pay | Admitting: Nurse Practitioner

## 2019-03-03 ENCOUNTER — Ambulatory Visit
Admission: RE | Admit: 2019-03-03 | Discharge: 2019-03-03 | Disposition: A | Discharge status: Home / Self Care | Payer: Medicaid Other | Source: Ambulatory Visit | Attending: Internal Medicine | Admitting: Internal Medicine

## 2019-03-03 ENCOUNTER — Other Ambulatory Visit: Payer: Self-pay | Admitting: Hospice and Palliative Medicine

## 2019-03-03 DIAGNOSIS — R180 Malignant ascites: Secondary | ICD-10-CM

## 2019-03-03 DIAGNOSIS — C220 Liver cell carcinoma: Secondary | ICD-10-CM | POA: Insufficient documentation

## 2019-03-03 MED ORDER — OXYCODONE HCL 10 MG PO TABS: 10.0000 mg | ORAL_TABLET | ORAL | 0 refills | Status: DC | PRN

## 2019-03-03 NOTE — Telephone Encounter (Signed)
Arranged for u/s paracentesis per v/o md order Merrily Pew, NP and Dr. Jacinto Reap).  Contacted Marcie Bal in [place]. Arranged for apt today for para and evaluation of non functioning pleurx cath. Per radiology nurse/Dr. Pascal Lux, pt will need to bring his own pleurx cath kit to avoid being charged for a kit. Arrival time at 1230 pm today in [facility].  Contacted patient and the above instructions were discuss with the patient. He gave verbal understanding to bring his pleurx cath kit and agreed to come at 1230 today.

## 2019-03-03 NOTE — Progress Notes (Signed)
Spoke with hospice nurse. Will refill oxycodone.

## 2019-03-03 NOTE — Procedures (Signed)
Pre Procedural Dx: Symptomatic Ascites, Malfunctioning peritoneal drain. Post Procedural Dx: Same  No intra-abdominal ascites.    As such, paracentesis was not attempted either via the PleurX catheter or via ultrasound guidance.   Excessive leakage from the entrance site of the drainage catheter into the peritoneal cavity, treated with Dermabond and Steri-Strips.   PLAN:  - The patient was instructed to evaluate for persistent ascitic leakage following the application of Dermabond and Steri-Strips to the catheters entrance site to the peritoneum.   - If the bedside application of Dermabond and Steri-Strips results in decrease ascitic leakage, the patient was instructed to attempt abdominal drainage via the peritoneal catheter at the earliest sign of abdominal distension in hopes of decreasing excessive abdominal pressure prior to the redevelopment of a pericatheter leak.   - If the patient continues to experience large volume ascitic leakage, definitive peritoneal drainage catheter revision/replacement will likely have to be pursued.    Ronny Bacon, MD Pager #: [phone]

## 2019-03-04 ENCOUNTER — Other Ambulatory Visit: Payer: Self-pay | Admitting: Hospice and Palliative Medicine

## 2019-03-04 ENCOUNTER — Telehealth: Payer: Self-pay | Admitting: Hospice and Palliative Medicine

## 2019-03-04 ENCOUNTER — Other Ambulatory Visit: Payer: Self-pay

## 2019-03-04 DIAGNOSIS — C220 Liver cell carcinoma: Secondary | ICD-10-CM

## 2019-03-04 DIAGNOSIS — R5383 Other fatigue: Secondary | ICD-10-CM

## 2019-03-04 NOTE — Progress Notes (Addendum)
Patient is pending pleurx revision on Friday. Will check labs at that time. CBC, CMP, ammonia, UA due to fatigue and intermittent confusion.   I called and spoke with patient's wife. Discussed the procedure being scheduled for Friday at [facility]. She knows he is to be NPO after midnight on Thursday. .  She is trying to get patient's sister here to help with his care. We discussed expected signs as he declines and approaches end of life.

## 2019-03-04 NOTE — Telephone Encounter (Signed)
Patient was seen by IR yesterday due to malfunctioning drain.  Steri-Strips and Dermabond were applied and then attempt to stop leakage around the drain.  There was discussion about probable need for complete revision.  Patient's wife called on-call overnight as patient has continued to have large amounts of leakage around the drain that is saturating his clothes and sheets.   Case discussed with Dr. Pascal Lux.  We will plan for patient to return to IR for probable complete revision of the drain.  Per Dr. Pascal Lux, hospice nurse can apply an ostomy bag over the site to manage leakage until patient can be seen again by IR.  I spoke several times with both patient's wife and the hospice nurse, April, regarding the situation.

## 2019-03-04 NOTE — Addendum Note (Signed)
Addended by: Altha Harm R on: 03/04/2019 02:52 PM   Modules accepted: Orders

## 2019-03-05 ENCOUNTER — Other Ambulatory Visit: Payer: Self-pay | Admitting: Radiology

## 2019-03-06 ENCOUNTER — Ambulatory Visit
Admission: RE | Admit: 2019-03-06 | Discharge: 2019-03-06 | Disposition: A | Discharge status: Home / Self Care | Payer: Medicaid Other | Source: Ambulatory Visit | Attending: Interventional Radiology | Admitting: Interventional Radiology

## 2019-03-06 ENCOUNTER — Other Ambulatory Visit: Payer: Self-pay | Admitting: Hospice and Palliative Medicine

## 2019-03-06 ENCOUNTER — Other Ambulatory Visit: Payer: Self-pay

## 2019-03-06 ENCOUNTER — Ambulatory Visit
Admission: RE | Admit: 2019-03-06 | Discharge: 2019-03-06 | Disposition: A | Discharge status: Home / Self Care | Payer: Medicaid Other | Source: Ambulatory Visit | Attending: Internal Medicine | Admitting: Internal Medicine

## 2019-03-06 DIAGNOSIS — Z4803 Encounter for change or removal of drains: Secondary | ICD-10-CM | POA: Diagnosis not present

## 2019-03-06 DIAGNOSIS — C220 Liver cell carcinoma: Secondary | ICD-10-CM | POA: Diagnosis present

## 2019-03-06 DIAGNOSIS — Z87891 Personal history of nicotine dependence: Secondary | ICD-10-CM | POA: Insufficient documentation

## 2019-03-06 DIAGNOSIS — R188 Other ascites: Secondary | ICD-10-CM | POA: Insufficient documentation

## 2019-03-06 DIAGNOSIS — Z79899 Other long term (current) drug therapy: Secondary | ICD-10-CM | POA: Insufficient documentation

## 2019-03-06 HISTORY — PX: IR GUIDED DRAIN W CATHETER PLACEMENT: IMG719

## 2019-03-06 LAB — CBC WITH DIFFERENTIAL/PLATELET
Abs Immature Granulocytes: 0.01 10*3/uL (ref 0.00–0.07)
Basophils Absolute: 0 10*3/uL (ref 0.0–0.1)
Basophils Relative: 0 %
Eosinophils Absolute: 0.1 10*3/uL (ref 0.0–0.5)
Eosinophils Relative: 3 %
HCT: 33.8 % — ABNORMAL LOW (ref 39.0–52.0)
Hemoglobin: 11.9 g/dL — ABNORMAL LOW (ref 13.0–17.0)
Immature Granulocytes: 0 %
Lymphocytes Relative: 22 %
Lymphs Abs: 0.9 10*3/uL (ref 0.7–4.0)
MCH: 33.8 pg (ref 26.0–34.0)
MCHC: 35.2 g/dL (ref 30.0–36.0)
MCV: 96 fL (ref 80.0–100.0)
Monocytes Absolute: 0.4 10*3/uL (ref 0.1–1.0)
Monocytes Relative: 10 %
Neutro Abs: 2.5 10*3/uL (ref 1.7–7.7)
Neutrophils Relative %: 65 %
Platelets: 59 10*3/uL — ABNORMAL LOW (ref 150–400)
RBC: 3.52 MIL/uL — ABNORMAL LOW (ref 4.22–5.81)
RDW: 16.7 % — ABNORMAL HIGH (ref 11.5–15.5)
WBC: 3.8 10*3/uL — ABNORMAL LOW (ref 4.0–10.5)
nRBC: 0 % (ref 0.0–0.2)

## 2019-03-06 LAB — COMPREHENSIVE METABOLIC PANEL
ALT: 64 U/L — ABNORMAL HIGH (ref 0–44)
AST: 178 U/L — ABNORMAL HIGH (ref 15–41)
Albumin: 2.7 g/dL — ABNORMAL LOW (ref 3.5–5.0)
Alkaline Phosphatase: 223 U/L — ABNORMAL HIGH (ref 38–126)
Anion gap: 9 (ref 5–15)
BUN: 37 mg/dL — ABNORMAL HIGH (ref 8–23)
CO2: 34 mmol/L — ABNORMAL HIGH (ref 22–32)
Calcium: 9.1 mg/dL (ref 8.9–10.3)
Chloride: 91 mmol/L — ABNORMAL LOW (ref 98–111)
Creatinine, Ser: 1.37 mg/dL — ABNORMAL HIGH (ref 0.61–1.24)
GFR calc Af Amer: >60 mL/min (ref >60)
GFR calc non Af Amer: 55 mL/min — ABNORMAL LOW (ref >60)
Glucose, Bld: 108 mg/dL — ABNORMAL HIGH (ref 70–99)
Potassium: 3.3 mmol/L — ABNORMAL LOW (ref 3.5–5.1)
Sodium: 134 mmol/L — ABNORMAL LOW (ref 135–145)
Total Bilirubin: 2.6 mg/dL — ABNORMAL HIGH (ref 0.3–1.2)
Total Protein: 6.9 g/dL (ref 6.5–8.1)

## 2019-03-06 LAB — AMMONIA: Ammonia: 29 umol/L (ref 9–35)

## 2019-03-06 MED ORDER — OXYCODONE HCL 10 MG PO TABS: 10.0000 mg | ORAL_TABLET | ORAL | 0 refills | Status: DC | PRN

## 2019-03-06 MED ORDER — CEFAZOLIN SODIUM-DEXTROSE 2-4 GM/100ML-% IV SOLN
INTRAVENOUS | Status: AC
  Filled 2019-03-06: qty 100

## 2019-03-06 MED ORDER — CEFAZOLIN SODIUM-DEXTROSE 2-4 GM/100ML-% IV SOLN
2.0000 g | INTRAVENOUS | Status: AC
  Administered 2019-03-06: 2 g via INTRAVENOUS

## 2019-03-06 MED ORDER — FENTANYL CITRATE (PF) 100 MCG/2ML IJ SOLN
INTRAMUSCULAR | Status: AC
  Filled 2019-03-06: qty 4

## 2019-03-06 MED ORDER — IODIXANOL 320 MG/ML IV SOLN
50.0000 mL | Freq: Once | INTRAVENOUS | Status: AC | PRN
  Administered 2019-03-06: 1 mL

## 2019-03-06 MED ORDER — FENTANYL CITRATE (PF) 100 MCG/2ML IJ SOLN
INTRAMUSCULAR | Status: AC | PRN
  Administered 2019-03-06: 50 ug via INTRAVENOUS

## 2019-03-06 MED ORDER — MIDAZOLAM HCL 5 MG/5ML IJ SOLN
INTRAMUSCULAR | Status: AC | PRN
  Administered 2019-03-06 (×2): 1 mg via INTRAVENOUS

## 2019-03-06 MED ORDER — SODIUM CHLORIDE 0.9 % IV SOLN
INTRAVENOUS | Status: DC
  Administered 2019-03-06: 10:00:00 via INTRAVENOUS

## 2019-03-06 MED ORDER — LIDOCAINE HCL (PF) 1 % IJ SOLN
INTRAMUSCULAR | Status: AC | PRN
  Administered 2019-03-06: 15 mL

## 2019-03-06 MED ORDER — MIDAZOLAM HCL 5 MG/5ML IJ SOLN
INTRAMUSCULAR | Status: AC
  Filled 2019-03-06: qty 5

## 2019-03-06 NOTE — Progress Notes (Signed)
I spoke with hospice nurse.  Patient is declining over the past several days.  It appears that he is been taking more of his pain medication than prescribed.  He only has 9 pills left and not enough to last through the weekend.  Wife has agreed to take his pain medication and manage it.  I called and spoke with patient's wife.  She was tearful as she described his decline in understanding that he is likely rapidly nearing end-of-life.  Patient is currently having his Pleurx exchanged.

## 2019-03-06 NOTE — Progress Notes (Signed)
Labs drawn by venipuncture tech. Dr. Pascal Lux at bedside, speaking extensively with wife and pt. Right lat. Pleurix Drain dressing changed out by MD and J.Dietz, RN. Pt. Tolerated fairly well. Pt. Remains "groggy", yet still answering questions appropriately. Wife states 'I took his fentanyl patch off at midnight because he was too 'groggy."  Await urine sample from pt. Pt. Wife assisting pt. With lunch.

## 2019-03-06 NOTE — Consult Note (Signed)
Chief Complaint: Persistent leakage surrounding tunneled pleural drainage catheter   Referring Physician(s): Brahmanday,Govinda R  Patient Status: [facility] - Out-pt  History of Present Illness: Jacob Wilcox is a 63 y.o. male with past medical history significant for hepatitis C, cirrhosis and multifocal hepatocellular carcinoma who underwent palliative placement of a tunneled abdominal Pleurx drainage catheter on 02/20/2019 secondary to recurrent symptomatic intra-abdominal ascites.  Unfortunately, since the time shortly following the tunneled drainage catheter placement, the patient has experienced persistent ascitic leakage from the tunnel drain.  Sonographic evaluation was performed earlier this week and was negative for the presence of any significant intra-abdominal ascites.  The leakage site was opposed with Dermabond and Steri-Strips, however despite this, the patient has experienced persistent leakage.  CT scan of the abdomen pelvis today is negative for the presence of any intra-abdominal ascites.  As such, decision made to proceed with definitive tunnel Pleurx catheter revision/replacement.  Patient is otherwise without complaint.  Specifically, no fever or chills.    Past Medical History:  Diagnosis Date   Cancer ([place])    Stage 4 Liver   Hepatitis C     Past Surgical History:  Procedure Laterality Date   HERNIA REPAIR     not sure what it was--when he was a baby   IR GUIDED DRAIN W CATHETER PLACEMENT  02/20/2019    Allergies: Fentanyl and Poison ivy extract  Medications: Prior to Admission medications   Medication Sig Start Date End Date Taking? Authorizing Provider  fentaNYL (DURAGESIC) 25 MCG/HR Place 1 patch onto the skin every 3 (three) days. 02/24/19  Yes Borders, Kirt Boys, NP  furosemide (LASIX) 40 MG tablet Take 1 tablet (40 mg total) by mouth daily. May take an extra dose daily if needed for increased swelling 02/27/19  Yes Borders, Kirt Boys, NP    vitamin B-12 (CYANOCOBALAMIN) 1000 MCG tablet Take 1,000 mcg by mouth daily.   Yes [provider]  escitalopram (LEXAPRO) 10 MG tablet Take 0.5 tablets (5 mg total) by mouth daily. 02/03/19   Borders, Kirt Boys, NP  Loperamide HCl (IMODIUM A-D PO) Take by mouth daily as needed.    [provider]  naloxone [facility]) nasal spray 4 mg/0.1 mL See instrucitons Patient not taking: Reported on 02/12/2019 01/29/19   Verlon Au, NP  Oxycodone HCl 10 MG TABS Take 1 tablet (10 mg total) by mouth every 3 (three) hours as needed. 03/06/19   Borders, Kirt Boys, NP  polyethylene glycol (MIRALAX) 17 g packet Take 17 g by mouth daily. Patient not taking: Reported on 02/20/2019 02/09/19   Jacquelin Hawking, NP  potassium chloride SA (K-DUR) 20 MEQ tablet 1 pill twice a day Patient not taking: Reported on 02/20/2019 01/22/19   Cammie Sickle, MD  prochlorperazine (COMPAZINE) 10 MG tablet Take 1 tablet (10 mg total) by mouth every 6 (six) hours as needed for nausea or vomiting. Patient not taking: Reported on 02/20/2019 01/22/19   Cammie Sickle, MD  senna (SENOKOT) 8.6 MG TABS tablet Take 1 tablet (8.6 mg total) by mouth daily. Patient not taking: Reported on 02/20/2019 02/09/19   Jacquelin Hawking, NP  sulfamethoxazole-trimethoprim (BACTRIM) 400-80 MG tablet Take 1 tablet by mouth 2 (two) times daily. Patient not taking: Reported on 02/20/2019 02/03/19   Borders, Kirt Boys, NP     History reviewed. No pertinent family history.  Social History   Socioeconomic History   Marital status: Married    Spouse name: [name]    Number  of children: Not on file   Years of education: Not on file   Highest education level: Not on file  Occupational History   Not on file  Social Needs   Financial resource strain: Not very hard   Food insecurity    Worry: Never true    Inability: Never true   Transportation needs    Medical: No    Non-medical: No  Tobacco Use   Smoking status: Never  Smoker   Smokeless tobacco: Never Used  Substance and Sexual Activity   Alcohol use: Yes   Drug use: Never   Sexual activity: Not on file  Lifestyle   Physical activity    Days per week: Not on file    Minutes per session: Not on file   Stress: To some extent  Relationships   Social connections    Talks on phone: More than three times a week    Gets together: Not on file    Attends religious service: Not on file    Active member of club or organization: Not on file    Attends meetings of clubs or organizations: Not on file    Relationship status: Not on file  Other Topics Concern   Not on file  Social History Narrative   Lives in [place]; with wife; Engineer, drilling; smoke 1ppd x 30 years; alcohol 6 pack beer/day.     ECOG Status: 2 - Symptomatic, <50% confined to bed  Review of Systems: A 12 point ROS discussed and pertinent positives are indicated in the HPI above.  All other systems are negative.  Review of Systems  Constitutional: Negative for activity change and fever.  Respiratory: Negative.   Cardiovascular: Negative.     Vital Signs: BP 110/77    Pulse 80    Temp 97.7 F (36.5 C) (Oral)    Resp 17    Ht 5\' 10"  (1.778 m)    Wt 72.6 kg    SpO2 96%    BMI 22.96 kg/m   Physical Exam Vitals signs and nursing note reviewed.  Constitutional:      Appearance: He is ill-appearing.  Abdominal:       Comments: Location of the tunneled peritoneal drainage catheter  Neurological:     Mental Status: He is alert.  Psychiatric:        Mood and Affect: Mood normal.        Behavior: Behavior normal.     Imaging: Ir Lenise Arena W Catheter Placement  Result Date: 02/20/2019 INDICATION: Hepatitis-C, hepatocellular cancer, malignant ascites, requiring multiple paracenteses EXAM: Ultrasound fluoroscopic tunneled abdominal peritoneal drain (abdominal PleurX) MEDICATIONS: Ancef 2 g within 1 hour of the procedure ANESTHESIA/SEDATION: Fentanyl 100 mcg IV; Versed 2.0 mg IV  Moderate Sedation Time:  25 minutes The patient was continuously monitored during the procedure by the interventional radiology nurse under my direct supervision. COMPLICATIONS: None immediate. PROCEDURE: Informed written consent was obtained from the patient after a thorough discussion of the procedural risks, benefits and alternatives. All questions were addressed. Maximal Sterile Barrier Technique was utilized including caps, mask, sterile gowns, sterile gloves, sterile drape, hand hygiene and skin antiseptic. A timeout was performed prior to the initiation of the procedure. Preliminary ultrasound performed. Large volume ascites marked in the right lower abdomen. Under sterile conditions and local anesthesia, ultrasound percutaneous needle access performed of the peritoneal cavity. Needle position confirmed with ultrasound. Images obtained for documentation. There was return of clear ascites. Amplatz guidewire inserted across the peritoneal cavity. Under sterile  conditions and local anesthesia, a subcutaneous tunnel was created in the adjacent soft tissues anteriorly. The abdominal PleurX was tunneled subcutaneously and inserted into the peritoneal cavity through the valve peel-away sheath. Position confirmed with ultrasound and fluoroscopy. Catheter secured with Prolene suture. Entry site closed with subcuticular Monocryl suture and Dermabond. Paracentesis performed after insertion yielding 850 cc clear peritoneal fluid. Patient tolerated the procedure well.  No immediate complication. IMPRESSION: Successful ultrasound and fluoroscopic tunneled abdominal peritoneal drain (abdominal PleurX). Electronically Signed   By: Jerilynn Mages.  Shick M.D.   On: 02/20/2019 11:23   US Abdomen Limited  Result Date: 03/03/2019 CLINICAL DATA:  History of cirrhosis and hepatocellular carcinoma with recurrent symptomatic ascites post image guided placement of a tunneled PleurX catheter on 02/20/2019 now with concern for catheter  malfunction. EXAM: LIMITED ABDOMEN ULTRASOUND FOR ASCITES TECHNIQUE: Limited ultrasound survey for ascites was performed in all four abdominal quadrants. COMPARISON:  Multiple previous ultrasound-guided paracenteses, most recently on 02/16/2019 yielding 3 L of serous fluid. Abdominal MRI-01/15/2019; CT abdomen pelvis-01/12/2019; image guided placement of tunneled peritoneal drainage catheter-02/20/2019 FINDINGS: Sonographic evaluation of the abdomen is negative for the presence of any significant intra-abdominal ascites. This was confirmed with real-time sonographic evaluation performed by the dictating interventional radiologist. On physical examination, there is persistent large volume leakage from the peritoneal drainage catheter insertion site. As such, Dermabond and Steri-Strips were applied over the leaking access site of the peritoneal drainage catheter as well as the exit site of peritoneal drain. IMPRESSION: 1. No intra-abdominal ascites. As such, paracentesis was not attempted either via the PleurX catheter or via ultrasound guidance. 2. Excessive leakage from the entrance site of the drainage catheter into the peritoneal cavity, treated with Dermabond and Steri-Strips. PLAN: - The patient was instructed to evaluate for persistent ascitic leakage following the application of Dermabond and Steri-Strips to the catheters entrance site to the peritoneum. - If the bedside application of Dermabond and Steri-Strips results in decrease ascitic leakage, the patient was instructed to attempt abdominal drainage via the peritoneal catheter at the earliest sign of abdominal distension in hopes of decreasing excessive abdominal pressure prior to the redevelopment of a pericatheter leak. - If the patient continues to experience large volume ascitic leakage, definitive peritoneal drainage catheter revision/replacement will likely have to be pursued. Electronically Signed   By: Sandi Mariscal M.D.   On: 03/03/2019 14:34    US Paracentesis  Result Date: 02/16/2019 INDICATION: 63 year old with hepatocellular carcinoma and ascites. EXAM: ULTRASOUND GUIDED PARACENTESIS MEDICATIONS: None. COMPLICATIONS: None immediate. PROCEDURE: Informed written consent was obtained from the patient after a discussion of the risks, benefits and alternatives to treatment. A timeout was performed prior to the initiation of the procedure. Initial ultrasound scanning demonstrates a large amount of ascites within the right lower abdominal quadrant. The right lower abdomen was prepped and draped in the usual sterile fashion. 1% lidocaine was used for local anesthesia. Following this, a 6 Fr Safe-T-Centesis catheter was introduced. An ultrasound image was saved for documentation purposes. The paracentesis was performed. The catheter was removed and a dressing was applied. The patient tolerated the procedure well without immediate post procedural complication. FINDINGS: A total of approximately 3 L of amber fluid was removed. IMPRESSION: Successful ultrasound-guided paracentesis yielding 3 liters of peritoneal fluid. Electronically Signed   By: Markus Daft M.D.   On: 02/16/2019 14:56   US Paracentesis  Result Date: 02/10/2019 INDICATION: History of cirrhosis and multifocal hepatocellular carcinoma with recurrent symptomatic intra-abdominal ascites. Please perform ultrasound-guided paracentesis for  therapeutic purposes. EXAM: ULTRASOUND-GUIDED PARACENTESIS COMPARISON:  Multiple previous ultrasound-guided paracenteses, most recently on 02/06/2019 yielding 2.9 L of peritoneal fluid. MEDICATIONS: None. COMPLICATIONS: None immediate. TECHNIQUE: Informed written consent was obtained from the patient after a discussion of the risks, benefits and alternatives to treatment. A timeout was performed prior to the initiation of the procedure. Initial ultrasound scanning demonstrates a small to moderate amount of ascites within the right mid hemiabdomen. The right mid  hemiabdomen was prepped and draped in the usual sterile fashion. 1% lidocaine with epinephrine was used for local anesthesia. Under direct ultrasound guidance, a 19 gauge, 7-cm, Yueh catheter was introduced. An ultrasound image was saved for documentation purposed. The paracentesis was performed. The catheter was removed and a dressing was applied. The patient tolerated the procedure well without immediate post procedural complication. FINDINGS: A total of approximately 2.6 liters of serous, slightly blood tinged peritoneal fluid was removed. IMPRESSION: Successful ultrasound-guided paracentesis yielding 2.6 liters of peritoneal fluid. Electronically Signed   By: Sandi Mariscal M.D.   On: 02/10/2019 11:20   US Paracentesis  Result Date: 02/06/2019 INDICATION: Hepatocellular carcinoma and reaccumulation of malignant ascites with abdominal distension and pain. EXAM: ULTRASOUND GUIDED PARACENTESIS MEDICATIONS: None. COMPLICATIONS: None immediate. PROCEDURE: Informed written consent was obtained from the patient after a discussion of the risks, benefits and alternatives to treatment. A timeout was performed prior to the initiation of the procedure. Initial ultrasound was performed to localize ascites. The right lower abdomen was prepped and draped in the usual sterile fashion. 1% lidocaine was used for local anesthesia. Following this, a 6 Fr Safe-T-Centesis catheter was introduced. An ultrasound image was saved for documentation purposes. The paracentesis was performed. The catheter was removed and a dressing was applied. The patient tolerated the procedure well without immediate post procedural complication. FINDINGS: A total of approximately 2.9 L of blood tinged fluid was removed. IMPRESSION: Successful ultrasound-guided paracentesis yielding 2.9 liters of peritoneal fluid. Electronically Signed   By: Aletta Edouard M.D.   On: 02/06/2019 11:23    Labs:  CBC: Recent Labs    02/06/19 1120 02/09/19 1126  02/12/19 1002 02/20/19 0905  WBC 2.8* 4.3 4.6 4.2  HGB 12.3* 13.1 12.7* 12.8*  HCT 34.3* 36.9* 36.9* 37.1*  PLT 36* 36* 41* 53*    COAGS: Recent Labs    01/13/19 1628 02/20/19 0905  INR 1.1 1.1  APTT 28  --     BMP: Recent Labs    02/06/19 1120 02/09/19 1126 02/12/19 1002 02/20/19 0905  NA 128* 132* 132* 134*  K 4.3 4.0 4.2 3.8  CL 95* 98 99 97*  CO2 25 28 26 30   GLUCOSE 99 94 103* 108*  BUN 21 24* 17 15  CALCIUM 8.9 9.2 8.9 9.0  CREATININE 0.60* 0.68 0.58* 0.58*  GFRNONAA >60 >60 >60 >60  GFRAA >60 >60 >60 >60    LIVER FUNCTION TESTS: Recent Labs    02/03/19 1023 02/06/19 1120 02/09/19 1126 02/12/19 1002  BILITOT 1.8* 1.8* 1.8* 2.8*  AST 193* 308* 268* 192*  ALT 53* 57* 88* 90*  ALKPHOS 180* 173* 230* 256*  PROT 6.8 6.7 6.7 7.1  ALBUMIN 2.5* 2.6* 2.6* 2.6*    TUMOR MARKERS: No results for input(s): AFPTM, CEA, CA199, CHROMGRNA in the last 8760 hours.  Assessment and Plan:  Jacob Wilcox is a 63 y.o. male with past medical history significant for hepatitis C, cirrhosis and multifocal hepatocellular carcinoma who underwent palliative placement of a tunneled abdominal Pleurx drainage catheter on  02/20/2019 secondary to recurrent symptomatic intra-abdominal ascites.  Unfortunately, since the time shortly following the tunneled drainage catheter placement, the patient has experienced persistent leakage from the tunnel drain.  As such, the decision made to proceed with definitive tunnel Pleurx catheter revision/replacement.  Patient is otherwise without complaint.    Risks and benefits fluoroscopic guided tunneled peritoneal drainage catheter revision/replacement was discussed with the patient including bleeding, infection, damage to adjacent structures, bowel perforation/fistula connection, catheter malfunction and sepsis.  All of the patient's questions were answered, patient is agreeable to proceed.  Consent signed and in chart.  Thank you for this  interesting consult.  I greatly enjoyed meeting Jacob Wilcox and look forward to participating in their care.  A copy of this report was sent to the requesting provider on this date.  Electronically Signed: Sandi Mariscal, MD 03/06/2019, 11:38 AM   I spent a total of 15 Minutes in face to face in clinical consultation, greater than 50% of which was counseling/coordinating care for tunneled abdominal Pleurx catheter evaluation and management.

## 2019-03-06 NOTE — Procedures (Signed)
Pre procedural Dx: Recurrent ascites; Leakage from peritoneal drain Post procedural Dx: Same  Technically successful fluoro guided revision/replacement of tunneled peritoneal drain.  EBL: None Complications: None immediate  Ronny Bacon, MD Pager #: [phone]

## 2019-03-06 NOTE — Progress Notes (Signed)
Spoke with Sharion Dove, NP. Pt. Unable to urinate at present. Assisted pt. With dresssing. Pleurix catheter intact, dry, intact without leakage at present. Pt. "grumpy." A&Ox3 now. Stable for DC home at present. Pt. Wife assisted with wheelchair escort also. Pt. Wife called sister to assist pt. When arriving home.

## 2019-03-09 ENCOUNTER — Telehealth: Payer: Self-pay | Admitting: Hospice and Palliative Medicine

## 2019-03-09 NOTE — Telephone Encounter (Signed)
I spoke with patient's hospice nurse by phone.  Pleurx catheter appears to be working well without any reported leakage.  Nurse drew off 1300 mL today.  Patient continues to have pain and has been requiring 1 to 2 tablets of oxycodone every 4 hours.

## 2019-03-11 ENCOUNTER — Telehealth: Payer: Self-pay | Admitting: Hospice and Palliative Medicine

## 2019-03-11 NOTE — Telephone Encounter (Signed)
UA results forwarded to me by hospice.  Nitrites and leukocyte esterase positive.  Culture pending.  Given confusion and UTI symptoms we will proceed with treatment.  Will start Bactrim DS twice daily x10 days.  I spoke with hospice nurse by phone who will call in antibiotics.  She says the patient is having some leakage again at the sites of the old paracentesis.

## 2019-03-13 ENCOUNTER — Telehealth: Payer: Self-pay | Admitting: Hospice and Palliative Medicine

## 2019-03-13 NOTE — Telephone Encounter (Signed)
Spoke with patient's wife by phone.  She says that she is giving him a Benadryl and it significantly helped his sleep.  She was asking if she could give both Benadryl and oxycodone at bedtime and I discouraged administration of both of those together.  Patient continues to be followed by hospice.

## 2019-03-16 ENCOUNTER — Telehealth: Payer: Self-pay | Admitting: Hospice and Palliative Medicine

## 2019-03-16 NOTE — Telephone Encounter (Signed)
I received an email from patient's wife.  I tried calling her but did not get her.  Message left.  I spoke with patient's hospice nurse, April.  She saw patient today in the home.  She was able to draw off 550 mL of ascites before stopping due to patient discomfort and hypotension with systolic BP in the 0000000.  She says that patient has an area of ulceration with sloughing present at site of previous drain.  She is going to explore option of having a WOC nurse evaluate in the home.  If that is not an option we discussed possibility referral to wound center.  No fever or chills reported.  Patient continues to take Bactrim DS, which was prescribed for UTI but should provide good coverage for skin.

## 2019-03-19 ENCOUNTER — Other Ambulatory Visit: Payer: Self-pay | Admitting: Hospice and Palliative Medicine

## 2019-03-19 MED ORDER — OXYCODONE HCL 10 MG PO TABS: 10.0000 mg | ORAL_TABLET | ORAL | 0 refills | Status: AC | PRN

## 2019-03-19 NOTE — Progress Notes (Signed)
I spoke with patient's hospice nurse, April. She requested a refill of the oxycodone. She is going to the house today to evaluate patient.

## 2019-03-20 ENCOUNTER — Telehealth: Payer: Self-pay | Admitting: Hospice and Palliative Medicine

## 2019-03-20 NOTE — Telephone Encounter (Signed)
Spoke with patient's wife by phone.  She was tearful as she described patient's decline.  She says that hospice weight him this week and he is down to 125 pounds.  He continues to eat poorly and has declining performance status.  We discussed end-of-life symptoms and I attempted to provide her with emotional support.  All questions answered to the best my ability.

## 2019-03-24 ENCOUNTER — Other Ambulatory Visit: Payer: Self-pay | Admitting: Hospice and Palliative Medicine

## 2019-03-24 MED ORDER — MORPHINE SULFATE (CONCENTRATE) 10 MG /0.5 ML PO SOLN: 5.0000 mg | ORAL | 0 refills | Status: AC | PRN

## 2019-03-24 MED ORDER — HALOPERIDOL 0.5 MG PO TABS: 0.5000 mg | ORAL_TABLET | Freq: Four times a day (QID) | ORAL | 0 refills | Status: AC | PRN

## 2019-03-24 MED ORDER — LORAZEPAM 0.5 MG PO TABS: 0.5000 mg | ORAL_TABLET | Freq: Three times a day (TID) | ORAL | 0 refills | Status: DC

## 2019-03-24 NOTE — Progress Notes (Signed)
Spoke with hospice nurse. Patient has had increased pain and anxiety. Also has had some visual hallucinations. Overall, patient is declining. She does not feel that the oxycodone is helping to control the pain. Likely is not having much benefit from fentanyl due to weight loss. Nurse is accessing the comfort kit and will I send Rx for morphine, lorazepam, and haldol.

## 2019-03-26 ENCOUNTER — Other Ambulatory Visit: Payer: Self-pay | Admitting: Hospice and Palliative Medicine

## 2019-03-26 MED ORDER — HALOPERIDOL LACTATE 2 MG/ML PO CONC: 1.0000 mg | Freq: Four times a day (QID) | ORAL | 0 refills | Status: AC | PRN

## 2019-03-26 MED ORDER — LORAZEPAM 1 MG/0.5ML PO CONC: 0.5000 mg | ORAL | 0 refills | Status: AC | PRN

## 2019-03-26 NOTE — Progress Notes (Signed)
Spoke with hospice nurse.  Patient is rapidly declining.  He is no longer able to tolerate oral medications in tablet form.  We will switch lorazepam and Haldol to elixir.  Patient has morphine elixir in the home.

## 2019-04-26 DEATH — deceased

## 2020-03-11 IMAGING — CT CT ABD-PELV W/O CM
2 of 4 series · 14 of 46 positions shown, 16 images · non-contrast
Comparison: CT abdomen pelvis-01/12/2019; abdominal MRI-01/15/2019;
image guided PleurX catheter placement-02/20/2019

CLINICAL DATA: Persistent drainage/leakage from recently placed
tunneled peritoneal PleurX catheter.

EXAM:
CT ABDOMEN AND PELVIS WITHOUT CONTRAST
TECHNIQUE: Multidetector CT imaging of the abdomen and pelvis was performed
following the standard protocol without IV contrast.

[Series 2: axial st · axial · 0.67mm/px · z∈[-483,-133]mm · 11 of 84 slices shown, 13 images]
[im 7/84  soft-tissue]
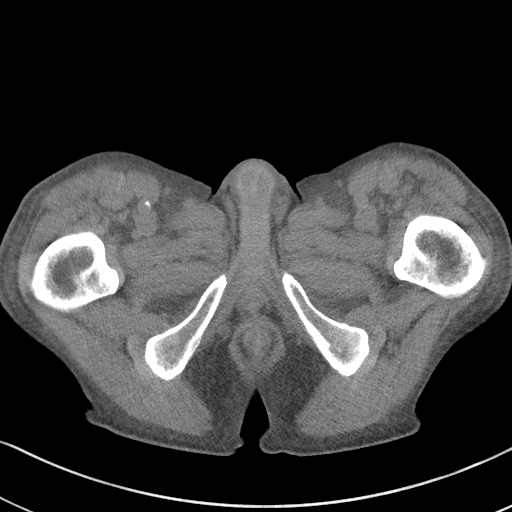
[im 7/84  bone]
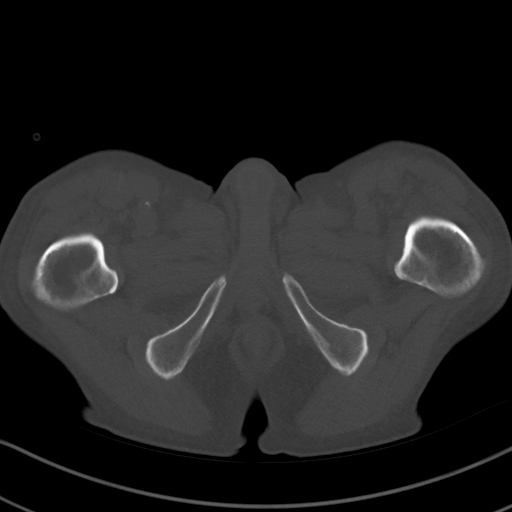
[im 14/84  soft-tissue]
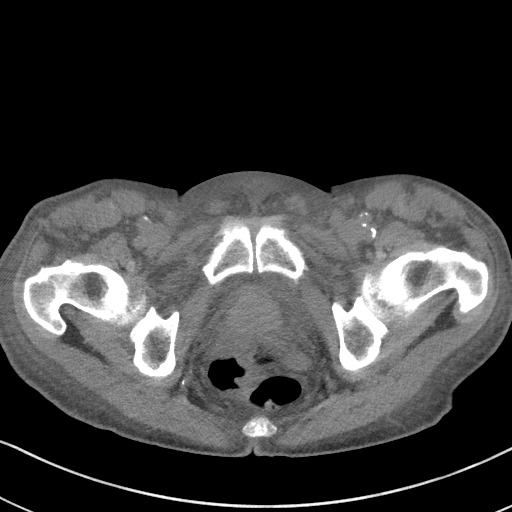
[im 20/84  soft-tissue]
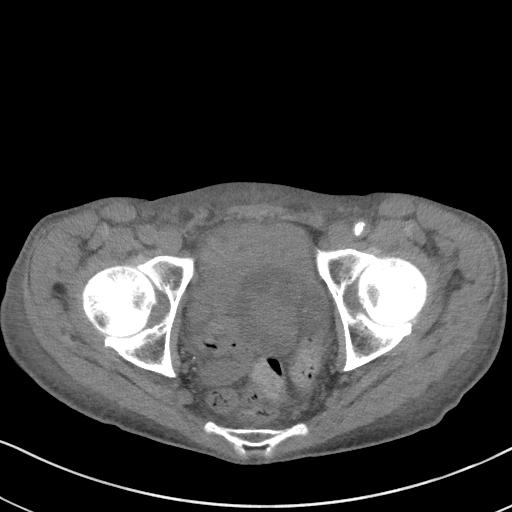
[im 27/84  soft-tissue]
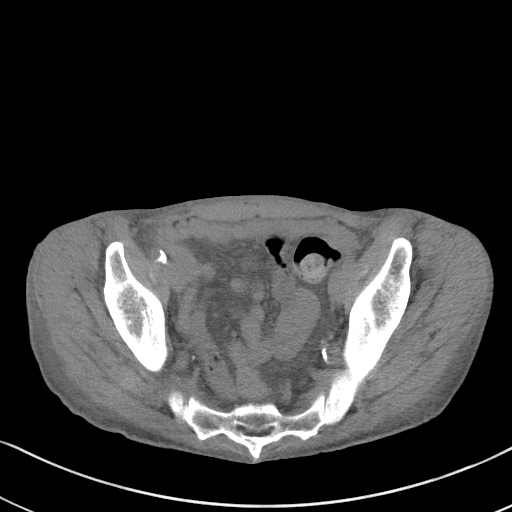
[im 34/84  soft-tissue]
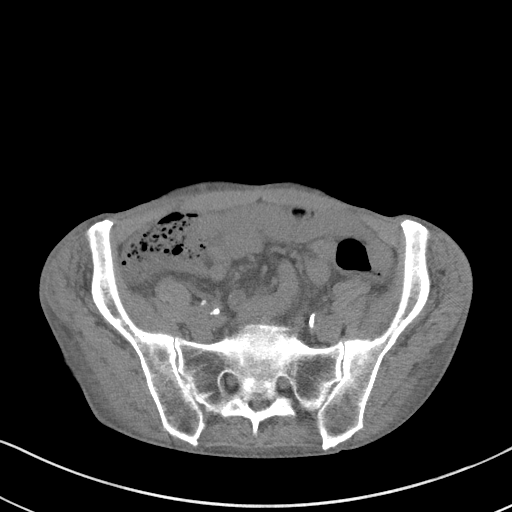
[im 44/84  soft-tissue]
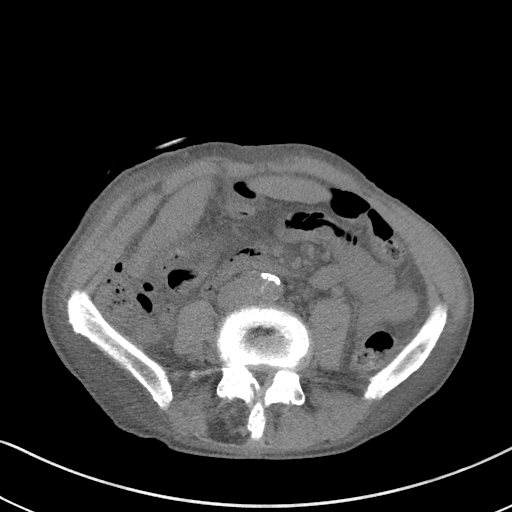
[im 50/84  soft-tissue]
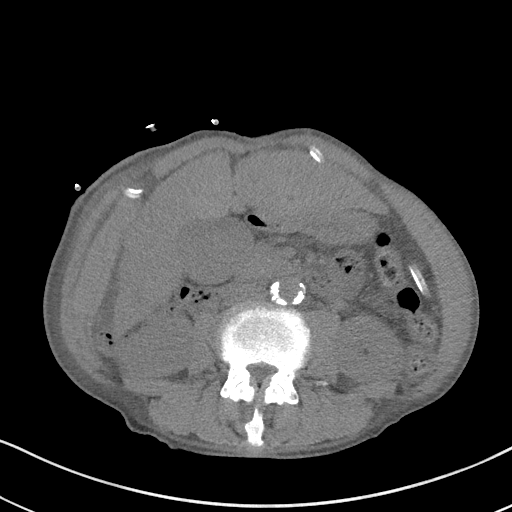
[im 57/84  soft-tissue]
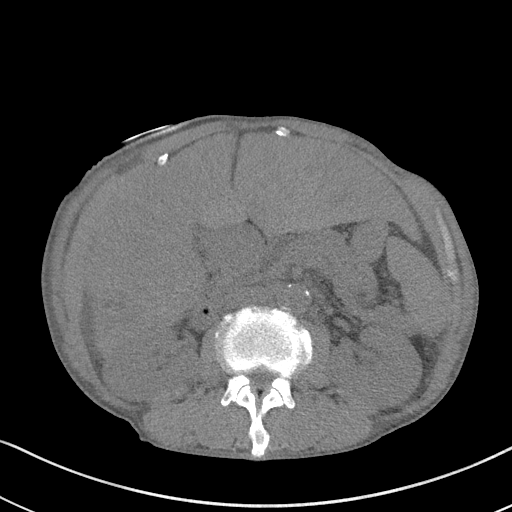
[im 64/84  soft-tissue]
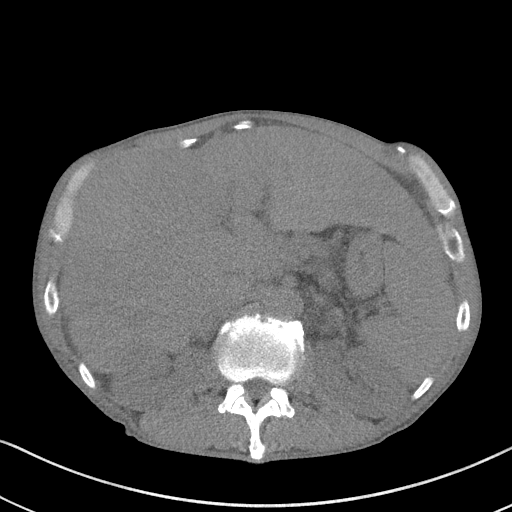
[im 64/84  bone]
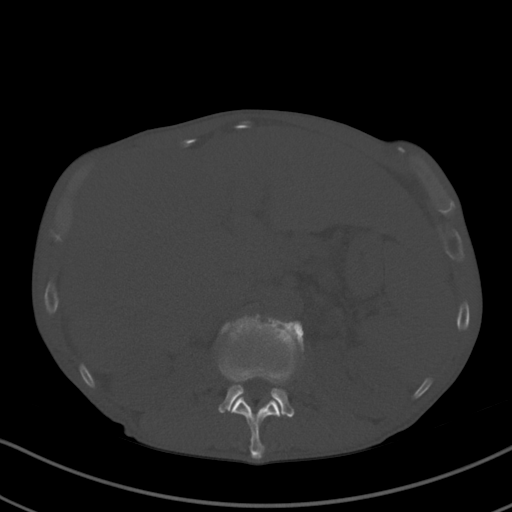
[im 70/84  soft-tissue]
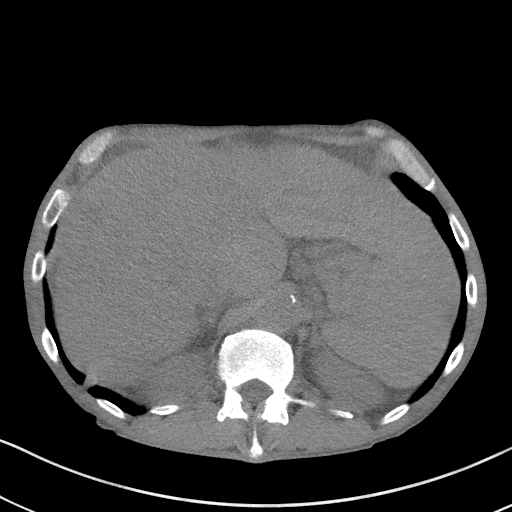
[im 77/84  soft-tissue]
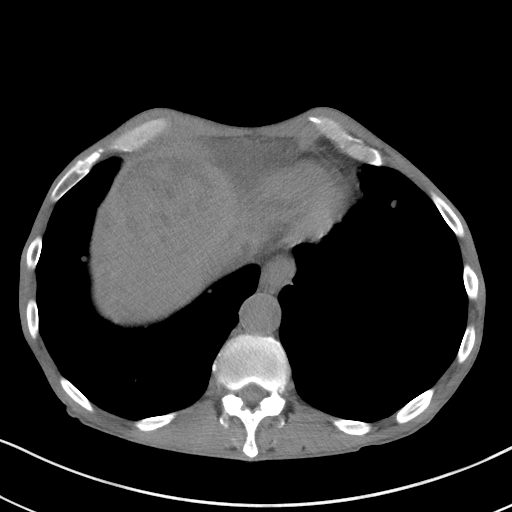

[Series 5: coronal st · coronal · 0.70mm/px · 3 of 81 slices shown]
[im 27/81  soft-tissue]
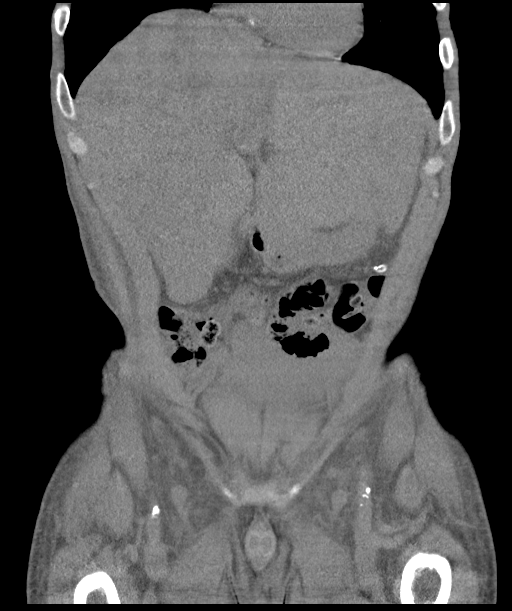
[im 36/81  soft-tissue]
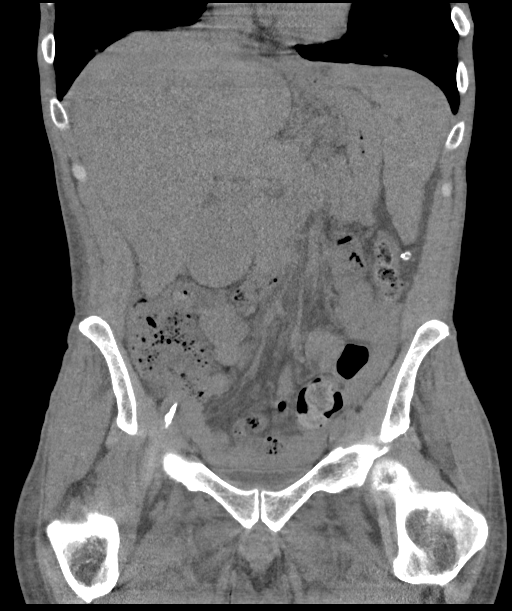
[im 45/81  soft-tissue]
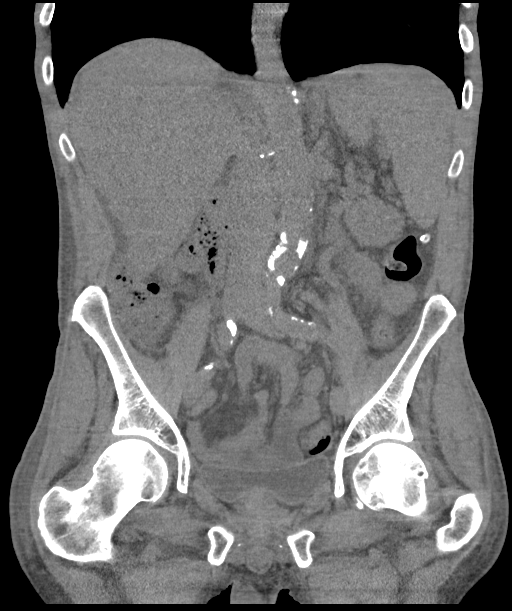

[14 of 46 positions shown; findings below may reference images not displayed]

FINDINGS: The lack of intravenous contrast limits the ability to evaluate
solid abdominal organs

Lower chest: Limited visualization of the lower thorax demonstrates
innumerable pulmonary metastases, progressed compared to the [DATE]
examination with index right lower lobe pulmonary nodule now
measuring 0.8 cm in greatest short axis diameter (image 7, series
3), previously, 0.5 cm, and index right lower lobe pulmonary nodule
now measuring 1.1 cm (image 3, series 3), previously, 0.7 cm).

No pleural effusion.

Normal heart size. Trace amount of pericardial fluid, presumably
physiologic.

Hepatobiliary: Nodularity hepatic contour compatible with known
cirrhosis.

Redemonstrated multiple ill-defined mixed attenuating liver lesions
compatible with known multifocal hepatocellular carcinoma,
incompletely evaluated on this noncontrast examination. Dominant
lesion within the dome of the right lobe of the liver now measures
approximately 7.2 x 6.4 cm (image 9, series 2, previously, 6.5 x
cm.

High-density material is seen within the gallbladder, potentially
vicarious excretion of contrast from previous contrast examination.
There is no significant intra-abdominal ascites.

Pancreas: Normal noncontrast appearance of the pancreas.

Spleen: Normal noncontrast appearance of the spleen. No evidence of
splenomegaly.

Adrenals/Urinary Tract: Normal noncontrast appearance the bilateral
kidneys. No renal stones. No renal stones are seen along expected
course of either ureter or the urinary bladder. Normal appearance of
the urinary bladder given underdistention.

Normal noncontrast appearance of the bilateral adrenal glands.

Stomach/Bowel: Moderate to large colonic stool burden without
evidence of enteric obstruction. No pneumoperitoneum, pneumatosis or
portal venous gas. No discrete areas of bowel wall thickening on
this noncontrast examination. Normal noncontrast appearance of the
terminal ileum. The appendix is not definitively identified, however
there is no pericecal inflammatory change.

Vascular/Lymphatic: Large amount of irregular atherosclerotic plaque
within a tortuous but normal caliber abdominal aorta.

No bulky retroperitoneal, mesenteric, pelvic or inguinal
lymphadenopathy.

Reproductive: Prostate is borderline enlarged with minimal mass
effect on the undersurface of the urinary bladder. There is a small
amount of fluid within the pelvic cul-de-sac.

Other: Stable positioning of tunneled PleurX drainage catheter
without evidence of catheter kink, fracture or discontinuity. The
tip of the pleural drainage catheter is noted to be located within
the left mid hemiabdomen about the inferior tip of the spleen.

The length of the subcutaneous tract of the tunneled abdominal
drainage catheter is noted to be short and also directed in a caudal
to cranial trajectory. This finding is associated with asymmetric
subcutaneous edema/ascites about the adjacent right ventral
abdominal wall.

Musculoskeletal: No acute or aggressive osseous abnormalities.
Mild-to-moderate multilevel lumbar spine DDD, worse at L5-S1 with
disc space height loss, endplate irregularity and sclerosis. Old
left ninth rib fracture.
IMPRESSION: 1. Stable positioning of tunneled PleurX drainage catheter without
kink, fracture or discontinuity.
2. Note is made of short-segment length of the subcutaneous tract of
the tunneled peritoneal drainage catheter which is directed in a
caudal to cranial trajectory, potentially attributing to patient's
complaint of persistent ascitic leak. This finding is also
associated with a minimal amount of adjacent asymmetric subcutaneous
edema/ascites along the adjacent right ventral abdominal wall.
3. Findings compatible with progression of disease including
suspected increased size of dominant lesion within the right lobe of
the liver as well as increase in size of multiple/innumerable
pulmonary nodules.

PLAN:
Patient subsequently underwent fluoroscopic guided tunneled
peritoneal drainage catheter revision/replacement.
